# Patient Record
Sex: Female | Born: 1950 | Race: Black or African American | Hispanic: No | Smoking: Former smoker
Health system: Southern US, Community
[De-identification: ages and names within clinical notes are randomized; demographics above are authoritative.]

## PROBLEM LIST (undated history)

## (undated) DIAGNOSIS — E7800 Pure hypercholesterolemia, unspecified: Secondary | ICD-10-CM

## (undated) DIAGNOSIS — I10 Essential (primary) hypertension: Secondary | ICD-10-CM

## (undated) HISTORY — PX: OTHER SURGICAL HISTORY: SHX169

---

## 2009-04-10 ENCOUNTER — Emergency Department (HOSPITAL_BASED_OUTPATIENT_CLINIC_OR_DEPARTMENT_OTHER): Admission: EM | Admit: 2009-04-10 | Discharge: 2009-04-10 | Payer: Self-pay | Admitting: Emergency Medicine

## 2009-12-01 ENCOUNTER — Emergency Department (HOSPITAL_BASED_OUTPATIENT_CLINIC_OR_DEPARTMENT_OTHER): Admission: EM | Admit: 2009-12-01 | Discharge: 2009-12-01 | Payer: Self-pay | Admitting: Emergency Medicine

## 2010-02-20 ENCOUNTER — Emergency Department (HOSPITAL_BASED_OUTPATIENT_CLINIC_OR_DEPARTMENT_OTHER): Admission: EM | Admit: 2010-02-20 | Discharge: 2010-02-20 | Payer: Self-pay | Admitting: Emergency Medicine

## 2010-02-20 ENCOUNTER — Ambulatory Visit: Payer: Self-pay | Admitting: Diagnostic Radiology

## 2010-02-24 ENCOUNTER — Ambulatory Visit: Payer: Self-pay | Admitting: Internal Medicine

## 2010-02-24 DIAGNOSIS — M542 Cervicalgia: Secondary | ICD-10-CM

## 2010-02-24 DIAGNOSIS — E669 Obesity, unspecified: Secondary | ICD-10-CM | POA: Insufficient documentation

## 2010-02-24 DIAGNOSIS — E785 Hyperlipidemia, unspecified: Secondary | ICD-10-CM

## 2010-02-24 DIAGNOSIS — I10 Essential (primary) hypertension: Secondary | ICD-10-CM | POA: Insufficient documentation

## 2010-03-01 DIAGNOSIS — J984 Other disorders of lung: Secondary | ICD-10-CM

## 2010-03-04 ENCOUNTER — Telehealth: Payer: Self-pay | Admitting: Internal Medicine

## 2010-03-04 ENCOUNTER — Ambulatory Visit: Payer: Self-pay | Admitting: Internal Medicine

## 2010-03-04 DIAGNOSIS — R209 Unspecified disturbances of skin sensation: Secondary | ICD-10-CM | POA: Insufficient documentation

## 2010-03-04 DIAGNOSIS — I2510 Atherosclerotic heart disease of native coronary artery without angina pectoris: Secondary | ICD-10-CM | POA: Insufficient documentation

## 2010-03-04 LAB — CONVERTED CEMR LAB
ALT: 11 units/L (ref 0–35)
AST: 18 units/L (ref 0–37)
Albumin: 4.3 g/dL (ref 3.5–5.2)
Chloride: 105 meq/L (ref 96–112)
Creatinine, Ser: 0.74 mg/dL (ref 0.40–1.20)
Glucose, Bld: 73 mg/dL (ref 70–99)
HDL: 65 mg/dL (ref >39)
Hgb A1c MFr Bld: 5.8 % — ABNORMAL HIGH (ref <5.7)
Indirect Bilirubin: 0.3 mg/dL (ref 0.0–0.9)
LDL Cholesterol: 57 mg/dL (ref 0–99)
Potassium: 4.3 meq/L (ref 3.5–5.3)
Sodium: 142 meq/L (ref 135–145)
TSH: 0.354 microintl units/mL (ref 0.350–4.500)
Total Bilirubin: 0.4 mg/dL (ref 0.3–1.2)
Total Protein: 6.8 g/dL (ref 6.0–8.3)
Triglycerides: 55 mg/dL (ref <150)

## 2010-03-06 ENCOUNTER — Encounter: Payer: Self-pay | Admitting: Internal Medicine

## 2010-03-07 ENCOUNTER — Ambulatory Visit: Payer: Self-pay | Admitting: Diagnostic Radiology

## 2010-03-07 ENCOUNTER — Ambulatory Visit (HOSPITAL_BASED_OUTPATIENT_CLINIC_OR_DEPARTMENT_OTHER): Admission: RE | Admit: 2010-03-07 | Discharge: 2010-03-07 | Payer: Self-pay | Admitting: Internal Medicine

## 2010-03-08 ENCOUNTER — Telehealth: Payer: Self-pay | Admitting: Internal Medicine

## 2010-03-14 ENCOUNTER — Encounter: Payer: Self-pay | Admitting: Internal Medicine

## 2010-03-14 LAB — CONVERTED CEMR LAB
AST: 16 units/L (ref 0–37)
Albumin: 4.2 g/dL (ref 3.5–5.2)
Alkaline Phosphatase: 95 units/L (ref 39–117)
BUN: 8 mg/dL (ref 6–23)
CO2: 29 meq/L (ref 19–32)
HDL: 68 mg/dL (ref >39)
Hgb A1c MFr Bld: 6 % — ABNORMAL HIGH (ref <5.7)
LDL Cholesterol: 42 mg/dL (ref 0–99)
Total CHOL/HDL Ratio: 1.7
VLDL: 8 mg/dL (ref 0–40)

## 2010-03-16 ENCOUNTER — Telehealth: Payer: Self-pay | Admitting: Internal Medicine

## 2010-03-24 ENCOUNTER — Telehealth: Payer: Self-pay | Admitting: Internal Medicine

## 2010-03-30 ENCOUNTER — Ambulatory Visit (HOSPITAL_COMMUNITY): Admission: RE | Admit: 2010-03-30 | Discharge: 2010-03-30 | Payer: Self-pay | Admitting: Internal Medicine

## 2010-03-31 ENCOUNTER — Telehealth: Payer: Self-pay | Admitting: Internal Medicine

## 2010-03-31 DIAGNOSIS — R220 Localized swelling, mass and lump, head: Secondary | ICD-10-CM

## 2010-03-31 DIAGNOSIS — R221 Localized swelling, mass and lump, neck: Secondary | ICD-10-CM

## 2010-04-06 ENCOUNTER — Ambulatory Visit: Payer: Self-pay | Admitting: Diagnostic Radiology

## 2010-04-06 ENCOUNTER — Ambulatory Visit (HOSPITAL_BASED_OUTPATIENT_CLINIC_OR_DEPARTMENT_OTHER): Admission: RE | Admit: 2010-04-06 | Discharge: 2010-04-06 | Payer: Self-pay | Admitting: Internal Medicine

## 2010-04-07 ENCOUNTER — Telehealth: Payer: Self-pay | Admitting: Internal Medicine

## 2010-04-22 ENCOUNTER — Ambulatory Visit: Payer: Self-pay | Admitting: Internal Medicine

## 2010-04-25 ENCOUNTER — Encounter (INDEPENDENT_AMBULATORY_CARE_PROVIDER_SITE_OTHER): Payer: Self-pay | Admitting: *Deleted

## 2010-05-03 ENCOUNTER — Encounter: Payer: Self-pay | Admitting: Internal Medicine

## 2010-05-09 ENCOUNTER — Telehealth: Payer: Self-pay | Admitting: Family

## 2010-05-10 ENCOUNTER — Telehealth: Payer: Self-pay | Admitting: Internal Medicine

## 2010-05-10 ENCOUNTER — Ambulatory Visit: Payer: Self-pay | Admitting: Internal Medicine

## 2010-05-11 ENCOUNTER — Telehealth: Payer: Self-pay | Admitting: Internal Medicine

## 2010-05-12 ENCOUNTER — Telehealth: Payer: Self-pay | Admitting: Internal Medicine

## 2010-05-14 ENCOUNTER — Ambulatory Visit (HOSPITAL_BASED_OUTPATIENT_CLINIC_OR_DEPARTMENT_OTHER): Admission: RE | Admit: 2010-05-14 | Discharge: 2010-05-14 | Payer: Self-pay | Admitting: Internal Medicine

## 2010-05-14 ENCOUNTER — Ambulatory Visit: Payer: Self-pay | Admitting: Diagnostic Radiology

## 2010-05-16 ENCOUNTER — Telehealth: Payer: Self-pay | Admitting: Internal Medicine

## 2010-05-17 ENCOUNTER — Telehealth: Payer: Self-pay | Admitting: Internal Medicine

## 2010-05-18 ENCOUNTER — Telehealth: Payer: Self-pay | Admitting: Internal Medicine

## 2010-05-24 ENCOUNTER — Encounter: Admission: RE | Admit: 2010-05-24 | Discharge: 2010-06-27 | Payer: Self-pay | Admitting: Internal Medicine

## 2010-05-30 ENCOUNTER — Encounter: Payer: Self-pay | Admitting: Internal Medicine

## 2010-06-10 ENCOUNTER — Telehealth: Payer: Self-pay | Admitting: Internal Medicine

## 2010-06-13 ENCOUNTER — Ambulatory Visit: Payer: Self-pay | Admitting: Internal Medicine

## 2010-07-07 ENCOUNTER — Encounter: Payer: Self-pay | Admitting: Internal Medicine

## 2010-09-01 ENCOUNTER — Ambulatory Visit: Payer: Self-pay | Admitting: Family

## 2010-09-01 DIAGNOSIS — J329 Chronic sinusitis, unspecified: Secondary | ICD-10-CM | POA: Insufficient documentation

## 2010-11-20 ENCOUNTER — Encounter: Payer: Self-pay | Admitting: Internal Medicine

## 2010-11-29 NOTE — Progress Notes (Signed)
Summary: med question   Phone Note Call from Patient   Caller: Patient Call For: Quetzally Callas  Summary of Call: when she takes the muscle relaxor it is 4 or 5 hours before it starts to work.  She wants the same generic muscle relaxor and the same pain med that she had before.  She is scheduled for her MRI today at 3:30.  Initial call taken by: Roselle Locus,  May 11, 2010 8:35 AM  Follow-up for Phone Call        Pt called back, stated she is cancelling today's appt with Dr Marisa Sprinkles. She is not going to have the biopsy until she sees a cardiologist Conchita Tomerlin  May 11, 2010 9:21 AM  Additional Follow-up for Phone Call Additional follow up Details #1::        metaxolone is same medication she was prescribed in the past Additional Follow-up by: D. Thomos Lemons DO,  May 11, 2010 11:58 AM    Additional Follow-up for Phone Call Additional follow up Details #2::    patient states that she can not tolerate the Tramadol. It makes her have a upset stomach the same was aspirin does. Is there something esle that she could  take for pain. Follow-up by: Glendell Docker CMA,  May 11, 2010 1:34 PM  Additional Follow-up for Phone Call Additional follow up Details #3:: Details for Additional Follow-up Action Taken: see next phone note Additional Follow-up by: D. Thomos Lemons DO,  May 12, 2010 5:37 PM

## 2010-11-29 NOTE — Progress Notes (Signed)
Summary: MRI Results  Phone Note Outgoing Call   Summary of Call: call pt - MRI of C spine shows multilevel cervical degenerative disc disease.  nerve root at C5 - C6 can be affected.  I will further discuss MRI results at next follow up appointment.   Initial call taken by: D. Thomos Lemons DO,  May 16, 2010 6:22 PM  Follow-up for Phone Call        attempted to contact patient at [phone], no answer, a detailed voice message was left informing patient per Dr Artist Pais instructions Follow-up by: Glendell Docker CMA,  May 17, 2010 9:54 AM

## 2010-11-29 NOTE — Assessment & Plan Note (Signed)
Summary: NUMBING IN HANDS/DK   Vital Signs:  Patient profile:   60 year old female Height:      65 inches Weight:      191.50 pounds BMI:     31.98 O2 Sat:      100 % on Room air Temp:     98.0 degrees F oral Pulse rate:   80 / minute Pulse rhythm:   regular Resp:     18 per minute BP sitting:   150 / 80  (left arm) Cuff size:   large  Vitals Entered By: Glendell Docker CMA (Mar 04, 2010 4:00 PM)  O2 Flow:  Room air CC: Rm 3- Unresolved numbness   Primary Care Provider:  Dondra Spry DO  CC:  Rm 3- Unresolved numbness.  History of Present Illness: 60 year old female for followup Patient complains of persistent intermittent numbness and tingling in right arm and right hand Patient seen for right neck and shoulder discomfort on previous visit neck and shoulder pain is better  Allergies: 1)  ! Morphine  Past History:  Past Medical History: Hyperlipidemia Hypertension CAD - 3 stents (Dr Harriette Bouillon [facility])  cath performed 2007- Hx of palpitations and syncope  Hx of tachycardia - s/p radiofreq ablation Hx of precancerous skin cancer around vulvar area - Dr. 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   Lungs:  normal respiratory effort and normal breath sounds.   Heart:  normal rate, regular rhythm, and no gallop.   Msk:  no thenar atrophy,  hand grip normal Neurologic:  cranial nerves  II-XII intact and DTRs symmetrical and normal.     Impression & Recommendations:  Problem # 1:  PARESTHESIA, HANDS (ICD-40.64)  60 year old  female with intermittent right hand and arm numbness.  carpal tunnel syndrome vs cervical radiculopathy. Refer to orthopedics for further evaluation (nerve conduction study)  Use wrist splint at bedtime.  Reviewed stretching exercises. Orders: Orthopedic Referral (Ortho)  Problem # 2:  HYPERTENSION (ICD-401.9) blood pressure control is suboptimal. Add losartan 50 mg Her updated medication list for this problem includes:    Diltiazem Hcl 120 Mg Tabs (Diltiazem hcl) .Marland Kitchen... Take 1 tab by mouth once daily    Metoprolol Succinate 100 Mg Xr24h-tab (Metoprolol succinate) .Marland Kitchen... Take 1/2 tab by mouth once daily    Losartan Potassium 50 Mg Tabs (Losartan potassium) ..... One by mouth once daily  BP today: 150/80 Prior BP: 152/90 (02/24/2010)  Problem # 3:  PULMONARY NODULE (ICD-518.89) awaiting CT of Chest  Complete Medication List: 1)  Metaxalone 800 Mg Tabs (Metaxalone) .... One by mouth two times a day as needed 2)  Plavix 75 Mg Tabs (Clopidogrel bisulfate) .... Take 1 tab by mouth once daily 3)  Diltiazem Hcl 120 Mg Tabs (Diltiazem hcl) .... Take 1 tab by mouth once daily 4)  Metoprolol Succinate 100 Mg Xr24h-tab (Metoprolol succinate) .... Take 1/2 tab by mouth  once daily 5)  Isosorbide Mononitrate Cr 30 Mg Xr24h-tab (Isosorbide mononitrate) .... Take 1 tab by mouth once daily 6)  Aspir-low 81 Mg Tbec (Aspirin) .... Take 1 tab by mouth once daily 7)  Losartan Potassium 50 Mg Tabs (Losartan potassium) .... One by mouth once daily  Patient Instructions: 1)  Use right wrist splint every night.  2)  Perform stretching exercises as directed. 3)  BMP prior to visit, ICD-9: 401.9 4)  Hepatic Panel prior to visit, ICD-9:  414.00 5)  Lipid Panel prior to visit, ICD-9: 414.00 6)  TSH prior to visit, ICD-9: 401.9 7)  Please schedule a follow-up appointment  in 1 month. 8)  Please return for lab work one (1) week before your next appointment.  Prescriptions: LOSARTAN POTASSIUM 50 MG TABS (LOSARTAN POTASSIUM) one by mouth once daily  #30 x 1   Entered and Authorized by:   D. Thomos Lemons DO   Signed by:   D. Thomos Lemons DO on 03/04/2010   Method used:   Electronically to        [facility] [address] (retail)       [address] [place], [place]  [address]       Ph: [phone] or [phone]       Fax: [phone]   RxID:   [id number]   Current Allergies (reviewed today): ! MORPHINE

## 2010-11-29 NOTE — Progress Notes (Signed)
Summary: ct result  Phone Note Outgoing Call   Summary of Call: call pt - CT of chest confirms left lung nodule.  A PET scan is recommended to further evaluate.   see orders Initial call taken by: D. Thomos Lemons DO,  Mar 08, 2010 1:32 PM  Follow-up for Phone Call        Left message on machine to return my call.  Mervin Kung CMA  Mar 08, 2010 3:43 PM   Left message on machine to return my call.  Mervin Kung CMA  Mar 09, 2010 9:18 AM   Spoke to pt. She was notified by Myriam Jacobson of PET scan yesterday. Verified appt. times for ortho referral and lab work. Pt will f/u as scheduled.  Mervin Kung CMA  Mar 11, 2010 8:59 AM

## 2010-11-29 NOTE — Progress Notes (Signed)
Summary: Refill muscle relaxer  Phone Note Call from Patient Call back at Home Phone [phone]   Caller: Patient Reason for Call: Talk to Nurse Summary of Call: Pt started having muscle spasms again on Saturday afternoon, she has been taking Tylenol but she is still in pain, she would like a refill of the muscle relaxer since it seems to work Initial call taken by: Lannette Donath,  May 09, 2010 8:54 AM  Follow-up for Phone Call        Left message with [facility] to confirm that patient went to PT as scheduled. Follow-up by: Lemont Fillers FNP,  May 09, 2010 10:44 AM  Additional Follow-up for Phone Call Additional follow up Details #1::        Pt called back, is in pain, pls call  Maty Tomerlin  May 09, 2010 11:30 AM    Additional Follow-up for Phone Call Additional follow up Details #2::    Per Thurston Hole at [facility] rehab states that pt did not show for PT on 03/08/10 and has not rescheduled.  Nicki Guadalajara Fergerson CMA Duncan Dull)  May 09, 2010 1:59 PM  Follow-up by: Lemont Fillers FNP,  May 09, 2010 2:50 PM  Additional Follow-up for Phone Call Additional follow up Details #3:: Details for Additional Follow-up Action Taken: Patient did not keep her appointment with PT as recommended by Dr. Artist Pais for her pain.  No further refills until she is seen in the office for visit.  In the meantime she can use ibuprofen 400mg  every 6 hours as needed for pain. Pls advise patient. Additional Follow-up by: Lemont Fillers FNP,  May 09, 2010 2:53 PM   patient was advised to take Ibuprofen 400mg  every 6 hours as needed for pain. She was advised to schedule office visit if pain is not resolved per Sandford Craze instructions Glendell Docker CMA  May 09, 2010 3:07 PM

## 2010-11-29 NOTE — Progress Notes (Signed)
Summary: still having tingling and numbness   Phone Note Call from Patient Call back at [facility] Phone [phone]   Caller: Patient Call For: Cindy Ellison  Summary of Call: She wants Dr Artist Pais to know she is still having tingling and numbness in right arm and down her right side even with the muscle relaxor.   Also [place] she [place] not feel comfortable traveling until she is aware of what is wrong.  Would Dr Artist Pais give her a note to excuse her from this conference  Initial call taken by: Roselle Locus,  Mar 04, 2010 8:30 AM  Follow-up for Phone Call        call pt - I suggest she come back to our office for re evaluation today if possible.   we can provide note at office visit Follow-up by: D. Thomos Lemons DO,  Mar 04, 2010 1:09 PM  Additional Follow-up for Phone Call Additional follow up Details #1::        patient advised per Dr Artist Pais instructions, appointment has been scheduled for 3:45pm today Additional Follow-up by: Glendell Docker CMA,  Mar 04, 2010 1:27 PM

## 2010-11-29 NOTE — Assessment & Plan Note (Signed)
Summary: STILL IN PAIN/DT   Vital Signs:  Patient profile:   60 year old female Weight:      195.50 pounds BMI:     32.65 O2 Sat:      100 % on Room air Temp:     98.2 degrees F oral Pulse rate:   62 / minute Pulse rhythm:   regular Resp:     20 per minute BP sitting:   124 / 80  (right arm) Cuff size:   large  Vitals Entered By: Glendell Docker CMA (May 10, 2010 4:48 PM)  O2 Flow:  Room air CC: Rm 3- Right side body pain Is Patient Diabetic? No   Primary Care Provider:  Dondra Spry DO  CC:  Rm 3- Right side body pain.  History of Present Illness:  60 year old African American female complains of recurrence of right-sided pain/muscle spasm Discomfort is constant and she describes as  aching sensation no improvement and over-the-counter analgesics no right upper chamois weakness  Preventive Screening-Counseling & Management  Alcohol-Tobacco     Smoking Status: quit  Allergies: 1)  ! Morphine  Past History:  Past Medical History: Hyperlipidemia  Hypertension CAD - 3 stents (Dr Harriette Bouillon [facility])  cath performed 2007- Hx of palpitations and syncope  Hx of tachycardia - s/p radiofreq ablation Hx of precancerous skin cancer around vulvar area - Dr. Hyacinth Meeker  (s/p surgery 2009) GYN OSA - uses CPAP ( initiated by cardiologist)   Social History: Smoking Status:  quit  Physical Exam  General:  alert, well-developed, and well-nourished.   Neck:  supple and no masses.  decreased range of motion - cervical sidebending and rotation Lungs:  normal respiratory effort and normal breath sounds.   Heart:  normal rate, regular rhythm, and no gallop.   Neurologic:  cranial nerves II-XII intact and DTRs symmetrical and normal.     Impression & Recommendations:  Problem # 1:  NECK AND BACK PAIN (ICD-43.47) 60 year old African American female with recurrent right-sided neck pain. 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 One by mouth two times a day as needed    Aspir-low 81 Mg Tbec (Aspirin) .Marland Kitchen... Take 1 tab by mouth once daily    Tramadol Hcl 50 Mg Tabs (Tramadol hcl) ..... One by mouth two times a day as needed    Hydrocodone-acetaminophen 5-500 Mg Tabs (Hydrocodone-acetaminophen) ..... One by mouth two times a day prn  Orders: MRI without Contrast (MRI w/o Contrast)  Complete Medication List: 1)  Metaxalone 800 Mg Tabs (Metaxalone) .... One by mouth two times a day as needed 2)  Plavix 75 Mg Tabs (Clopidogrel bisulfate) .... Take 1 tab by mouth once daily 3)  Diltiazem Hcl 120 Mg Tabs (Diltiazem hcl) .... Take 1 tab by mouth once daily 4)  Metoprolol Succinate 100 Mg Xr24h-tab (Metoprolol succinate) .... Take 1/2 tab by mouth once daily 5)  Isosorbide Mononitrate Cr 30 Mg Xr24h-tab (Isosorbide mononitrate) .... Take 1 tab by mouth once daily 6)  Aspir-low 81 Mg Tbec (Aspirin) .... Take 1 tab by mouth once daily 7)  Losartan Potassium 50 Mg Tabs (Losartan potassium) .... One by mouth once daily 8)  Tramadol Hcl 50 Mg Tabs (Tramadol hcl) .... One by mouth two times a day as needed 9)  Alprazolam 0.25 Mg Tabs (Alprazolam) .... Take one  to two tabs 30 mins before mri 10)  Hydrocodone-acetaminophen 5-500 Mg Tabs (Hydrocodone-acetaminophen) .... One by mouth two times a day prn  Patient Instructions: 1)  Call our office if your symptoms do not  improve or gets worse. 2)  Please schedule a follow-up appointment in 1 month. Prescriptions: ALPRAZOLAM 0.25 MG TABS (ALPRAZOLAM) take one to two tabs 30 mins before MRI  #5 x 0   Entered and Authorized by:   D. Thomos Lemons DO   Signed by:   D. Thomos Lemons DO on 05/10/2010   Method used:   Print then Give to Patient   RxID:   [id number] TRAMADOL HCL 50 MG TABS (TRAMADOL HCL) one by mouth two times a day as needed  #30 x 0   Entered and  Authorized by:   D. Thomos Lemons DO   Signed by:   D. Thomos Lemons DO on 05/10/2010   Method used:   Print then Give to Patient   RxID:   [id number] METAXALONE 800 MG TABS (METAXALONE) one by mouth two times a day as needed  #30 x 0   Entered and Authorized by:   D. Thomos Lemons DO   Signed by:   D. Thomos Lemons DO on 05/10/2010   Method used:   Print then Give to Patient   RxID:   [id number]   Current Allergies (reviewed today): ! MORPHINE

## 2010-11-29 NOTE — Miscellaneous (Signed)
Summary: lab orders: bmp,hepfx,lipid,tsh,hgba1c  Clinical Lists Changes  Orders: Added new Test order of T-Basic Metabolic Panel [phone]) - Signed Added new Test order of T-Hepatic Function [id number]) - Signed Added new Test order of T-Lipid Profile [phone]) - Signed Added new Test order of T-TSH [phone]) - Signed Added new Test order of T- Hemoglobin A1C ([id number]) - Signed

## 2010-11-29 NOTE — Letter (Signed)
Summary: Primary Care Consult Scheduled Letter  [place] [facility]  [address]   [place], [place] [address]   Phone: [phone]  Fax: [phone]      04/25/2010 MRN: [id number]  [facility] [address] [place], [place]  [address]    Dear Ms. Azbill,      We have scheduled an appointment for you.  At the recommendation of Dr.YOO , we have scheduled you a consult with [place] [facility], DR PHIPPS  on JULY 5,2011 at 4PM.  Their address is_[address],[place] The office phone number is [phone].  If this appointment day and time is not convenient for you, please feel free to call the office of the doctor you are being referred to at the number listed above and reschedule the appointment.     Please take you CT and Pet Scan disc to your appointment .       It is important for you to keep your scheduled appointments. We are here to make sure you are given good patient care. If you have questions or you have made changes to your appointment, please notify us at  [phone], ask for HELEN.    Thank you,  Darral Dash Patient Care Coordinator [place] at [facility]

## 2010-11-29 NOTE — Consult Note (Signed)
Summary: [facility]   Imported By: Lanelle Bal 04/12/2010 11:41:31  _____________________________________________________________________  External Attachment:    Type:   Image     Comment:   External Document

## 2010-11-29 NOTE — Progress Notes (Signed)
Summary: Crestor Samples  Phone Note Call from Patient Call back at [facility] Phone [phone]   Caller: Patient Call For: D. Thomos Lemons DO Summary of Call: patient called and left voice message requesting samples of Crestor.  Call was returned to patient at [phone], no answerr, voice message was left for patient to return call. Crestor is not on her list of medication. Initial call taken by: Glendell Docker CMA,  June 10, 2010 1:18 PM  Follow-up for Phone Call        patient returned  from patient. She states she has been taking the crestor for years, and it has been prescribed by Dr Timoteo Gaul. She states she is taking Crestor 10mg , patient was in a rush to end the conversation, and stated she normally get samples from Dr Harriette Bouillon office however they are closed today and she did not want to go through any chnages. Patient was made aware approval would have to come from [facility]  because the Crestor is not on our  list of medications that she is taking, patient again was rushing to get off of the line and stated ok and disconnected the call. Follow-up by: Glendell Docker CMA,  June 10, 2010 1:27 PM  Additional Follow-up for Phone Call Additional follow up Details #1::        call Dr. Hans Eden office to confirm pt taking crestor. If yes, pt can pick up samples Additional Follow-up by: D. Thomos Lemons DO,  June 13, 2010 9:41 AM    Additional Follow-up for Phone Call Additional follow up Details #2::    call placed to Dr Theophilus Kinds office at [phone]. I spoke with Toniann Fail she verified that patient is taking the Crestor 52m daily, and ; she states patient was seen on 8/11 and provided one month of samples at that office visit.  Glendell Docker CMA  June 13, 2010 4:59 PM   call was placed to patient at  [phone], no answer, voice message left for patient to return call regarding Crestor Samples. (have been left at front desk for patient pick up ) Glendell Docker CMA  June 13, 2010  5:07 PM   Additional Follow-up for Phone Call Additional follow up Details #3:: Details for Additional Follow-up Action Taken: call placed to patient at [phone], she has been advised samples left at front desk for pick up. She was reminded she missed appointment yesterday. She states she has been busy moivng and forgot. Patient states she will call back to schedule Additional Follow-up by: Glendell Docker CMA,  June 14, 2010 8:11 AM  New/Updated Medications: CRESTOR 10 MG TABS (ROSUVASTATIN CALCIUM) Take 1 tablet by mouth once a day

## 2010-11-29 NOTE — Assessment & Plan Note (Signed)
Summary: new pt est care/dt   Vital Signs:  Patient profile:   60 year old female Height:      65 inches Weight:      188 pounds BMI:     31.40 Temp:     98.2 degrees F oral Pulse rate:   90 / minute BP sitting:   152 / 90  (left arm) Cuff size:   regular  Vitals Entered By: Payton Spark CMA (February 24, 2010 9:27 AM) CC: New to est. F/U ED- seen for B arm pain, shoulder and upper back pain. ED suggests CT for further eval of spot seen on lungs.   Primary Care Provider:  Dondra Spry DO  CC:  New to est. F/U ED- seen for B arm pain and shoulder and upper back pain. ED suggests CT for further eval of spot seen on lungs..  History of Present Illness: 60 yo female to establish and ER f/u  prev PCP in [place]  pt was in ER last sunday,  ER records reviewed not feeling well  bl arm pain,  neck pain, upper shoulder pain,  constant pain mild pain with deep breath describes an aching sensation when she lays on left side, right arm hurts severity 8 out of 10 she was given morphine in ER - caused nausea  no specific trigger.  lifted grandson pain better with rest tylenol dulls the pain she has seen chiropractor in the past - diagnosed with mild scolosis no recent infection, no fever some post nasal gtt  osa - hasn't use mask x 3 weeks,  poor fitting mask,  felt like she was suffocating  Allergies (verified): 1)  ! Morphine  Past History:  Past Medical History: Hyperlipidemia Hypertension CAD - 3 stents (Dr Harriette Bouillon [facility])  cath performed 2007- Hx of palpitations and syncope Hx of tachycardia - s/p radiofreq ablation Hx of precancerous skin cancer around vulvar area - Dr. 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    Physical Exam  General:  alert and overweight-appearing.   Head:  normocephalic and atraumatic.   Ears:  R ear normal and L ear normal.   Mouth:  pharynx pink and moist.   Neck:  supple, no masses, and no carotid bruits.    Lungs:  normal respiratory effort, normal breath sounds, no crackles, and no wheezes.   Heart:  normal rate, regular rhythm, and no gallop.   Abdomen:  soft, non-tender, normal bowel sounds, no masses, no hepatomegaly, and no splenomegaly.   Msk:  increased muscle tone upper trapezius Extremities:  No lower extremity edema  Neurologic:  cranial nerves II-XII intact.   Psych:  normally interactive, good eye contact, not anxious appearing, and not depressed appearing.     Impression & Recommendations:  Problem # 1:  NECK AND BACK PAIN (ICD-723.1) Neck and  upper back pain likely from muscle strain.  use muscle relaxer as directed.  arrange PT Her updated medication list for this problem includes:    Metaxalone 800 Mg Tabs (Metaxalone) ..... One by mouth two times a day as needed    Aspir-low 81 Mg Tbec (Aspirin) .Marland Kitchen... Take 1 tab by mouth once daily  Orders: Physical Therapy Referral (PT)  Problem # 2:  HYPERTENSION (ICD-401.9) She was prev followed by [facility].   goal BP < 130/80.  consider add ARB Her updated medication list for this problem includes:    Diltiazem Hcl 120 Mg Tabs (Diltiazem hcl) .Marland Kitchen... Take 1 tab by mouth once daily    Metoprolol Succinate 100 Mg Xr24h-tab (Metoprolol succinate) .Marland Kitchen... Take 1/2 tab by  mouth once daily  BP today: 152/90  Problem # 3:  HYPERLIPIDEMIA (ICD-272.4) Goal LDL <70.   arrange FLP  Complete Medication List: 1)  Metaxalone 800 Mg Tabs (Metaxalone) .... One by mouth two times a day as needed 2)  Plavix 75 Mg Tabs (Clopidogrel bisulfate) .... Take 1 tab by mouth once daily 3)  Diltiazem Hcl 120 Mg Tabs (Diltiazem hcl) .... Take 1 tab by mouth once daily 4)  Metoprolol Succinate 100 Mg Xr24h-tab (Metoprolol succinate) .... Take 1/2 tab by mouth once daily 5)  Isosorbide Mononitrate Cr 30 Mg Xr24h-tab (Isosorbide mononitrate) .... Take 1 tab by mouth once daily 6)  Aspir-low 81 Mg Tbec (Aspirin) .... Take 1 tab by mouth once daily  Patient Instructions: 1)  Please schedule a follow-up appointment in 1 month. 2)  Call our office if your symptoms do not  improve or gets worse. 3)  BMP prior to visit, ICD-9: 401.9 4)  Hepatic Panel prior to visit, ICD-9: 272.4 5)  Lipid Panel prior to visit, ICD-9:  272.4 6)  TSH prior to visit, ICD-9: 272.4 7)  HbgA1C prior to visit, ICD-9: 278.00 8)  Please return for lab work one (1) week before your next appointment.  Prescriptions: METAXALONE 800 MG TABS (METAXALONE) one by mouth two times a day as needed  #30 x 0   Entered and Authorized by:   D. Thomos Lemons DO   Signed by:   D. Thomos Lemons DO on 02/24/2010   Method used:   Electronically to        [facility] [address] (retail)       [address] [place], [place]  [address]       Ph: [phone] or [phone]       Fax: [phone]   RxID:   [id number]   Appended Document: new pt est care/dt     Allergies: 1)  ! Morphine   Impression & Recommendations:  Problem # 1:  PULMONARY NODULE (ICD-518.89)  CXR performed in ER showed - IMPRESSION:   0.8 cm nodular opacity left mid lung.  Chest CT recommend for   further evaluation.  Arrange CT of chest with IV contrast  Orders: Radiology Referral (Radiology)  Complete  Medication List: 1)  Metaxalone 800 Mg Tabs (Metaxalone) .... One by mouth two times a day as needed 2)  Plavix 75 Mg Tabs (Clopidogrel bisulfate) .... Take 1 tab by mouth once daily 3)  Diltiazem Hcl 120 Mg Tabs (Diltiazem hcl) .... Take 1 tab by mouth once daily 4)  Metoprolol Succinate 100 Mg Xr24h-tab (Metoprolol succinate) .... Take 1/2 tab by mouth once daily 5)  Isosorbide Mononitrate Cr 30 Mg Xr24h-tab (Isosorbide mononitrate) .... Take 1 tab by mouth once daily 6)  Aspir-low 81 Mg Tbec (Aspirin) .... Take 1 tab by mouth once daily

## 2010-11-29 NOTE — Progress Notes (Signed)
Summary: missed PET appt  Phone Note Call from Patient Call back at Home Phone [phone]   Caller: Patient Call For: D. Thomos Lemons DO Summary of Call: Pt called to advise Dr Artist Pais that she does not have transportation today so she could not make her PET appt, she spoke with the tech and he advised her that she would not be able to have a PET for a month Initial call taken by: Lannette Donath,  Mar 24, 2010 9:00 AM  Follow-up for Phone Call        Myriam Jacobson,  please find out what is scheduling issue Follow-up by: D. Thomos Lemons DO,  Mar 24, 2010 12:09 PM  Additional Follow-up for Phone Call Additional follow up Details #1::        Patient rescheduled  for PET Scan  [facility]   June  1,2011 ,  spoke with pt  appt confirmed   Need new order  ,  Additional Follow-up by: Darral Dash,  Mar 25, 2010 8:40 AM

## 2010-11-29 NOTE — Letter (Signed)
Summary: Generic Letter  [place] [facility]  [address]   [place], [place] [address]   Phone: [phone]  Fax: [phone]      03/04/2010   Cindy Ellison [address] [place], [place]  [address]      To Whom it may concern:  Please excuse [name] from up coming conference due to medical reasons. Please feel free to contact our office if you have any questions regarding this matter.           Sincerely,   Glendell Docker CMA Dr. Thomos Lemons

## 2010-11-29 NOTE — Progress Notes (Signed)
Summary: wants mri results and now has numbness   Phone Note Call from Patient Call back at Home Phone [phone]   Caller: Patient Call For: Sheralyn Pinegar  Summary of Call: she would like the MRI results.  She is still in pain and now she has numbness in her right hand in the index finger.   Initial call taken by: Roselle Locus,  May 17, 2010 10:03 AM  Follow-up for Phone Call        we discussed MRI results.  I suggest we defer referral to neurosurgeon unless symptoms severe. continue symptomatic treatment. trial of PT  Follow-up by: D. Thomos Lemons DO,  May 17, 2010 11:56 AM

## 2010-11-29 NOTE — Progress Notes (Signed)
  Phone Note Outgoing Call   Summary of Call: called pt re:  results of PET scan.  Increased uptake in right neck area.   we discussed obtaining CT of neck with IV contrast to evaluate Initial call taken by: D. Thomos Lemons DO,  March 31, 2010 5:17 PM  Follow-up for Phone Call        CT of neck scheduled [facility]  June 8th spoke with pt appt confirmed  Follow-up by: Darral Dash,  April 01, 2010 4:06 PM  New Problems: NECK MASS (ICD-784.2)   New Problems: NECK MASS (ICD-784.2)

## 2010-11-29 NOTE — Assessment & Plan Note (Signed)
Summary: 1 month follo wup/mhf   Vital Signs:  Patient profile:   60 year old female Height:      65 inches Weight:      192.50 pounds BMI:     32.15 O2 Sat:      99 % on Room air Temp:     98.1 degrees F oral Pulse rate:   62 / minute Pulse rhythm:   regular Resp:     18 per minute BP sitting:   134 / 80  (right arm) Cuff size:   large  Vitals Entered By: Glendell Docker CMA (April 22, 2010 4:07 PM)  O2 Flow:  Room air CC: Rm 3- 1 Month follow up Comments discuss follow up for ENT, pharmacy chnage to [facility] in [place]   Primary Care Provider:  Dondra Spry DO  CC:  Rm 3- 1 Month follow up.  History of Present Illness: 60 yo aa  for fu reviewed wu for pulm nodule previous smoker pet scan showed activity in neck she was referred to ent but never went she requests referral to ent in [place]  no hx of radiation exposure  Allergies: 1)  ! Morphine  Past History:  Past Medical History: Hyperlipidemia Hypertension CAD - 3 stents (Dr Harriette Bouillon [facility] cardi[facility])  cath performed 2007- Hx of palpitations and syncope  Hx of tachycardia - s/p radiofreq ablation Hx of precancerous skin cancer around vulvar area - Dr. 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   Mouth:  pharynx pink and moist.   Neck:  supple and no masses.   Lungs:  normal respiratory effort and normal breath sounds.   Heart:  normal rate, regular rhythm, and no gallop.     Impression & Recommendations:  Problem # 1:  NECK  MASS (ICD-784.2) refer to ent in [place].  Complete Medication List: 1)  Metaxalone 800 Mg Tabs (Metaxalone) .... One by mouth two times a day as needed 2)  Plavix 75 Mg Tabs (Clopidogrel bisulfate) .... Take 1 tab by mouth once daily 3)  Diltiazem Hcl 120 Mg Tabs (Diltiazem hcl) .... Take 1 tab by mouth once daily 4)  Metoprolol Succinate 100 Mg Xr24h-tab (Metoprolol succinate) .... Take 1/2 tab by mouth once daily 5)  Isosorbide Mononitrate Cr 30 Mg Xr24h-tab (Isosorbide mononitrate) .... Take 1 tab by mouth once daily 6)  Aspir-low 81 Mg Tbec (Aspirin) .... Take 1 tab by mouth once daily 7)  Losartan Potassium 50 Mg Tabs (Losartan potassium) .... One by mouth once daily  Patient Instructions: 1)  Please schedule a follow-up appointment in 2 months. 2)  Obtain CD of xray studies before your consultation with ENT  Current Allergies (reviewed today): ! MORPHINE

## 2010-11-29 NOTE — Progress Notes (Signed)
  Phone Note Outgoing Call   Summary of Call: discussed results of CT scan of neck with pt.  I suggest referral to ENT ASAP see orders Initial call taken by: D. Thomos Lemons DO,  April 07, 2010 5:30 PM  Follow-up for Phone Call        Pt is schedule to see Dr Conley Simmonds    June 15.  Follow-up by: Darral Dash,  April 11, 2010 11:10 AM

## 2010-11-29 NOTE — Progress Notes (Signed)
Summary: R/S PET scan  Phone Note Call from Patient Call back at [phone]   Summary of Call: Pt called and cancelled PET scan for 03/17/10 due to EOGs and staff shortage.  Wants to r/s for next week.  Called [facility] and r/s for 03/24/10 @ 6:30am.  Left message on machine for pt to return my call.  Mervin Kung CMA  Mar 16, 2010 2:27 PM   Follow-up for Phone Call        Pt returned my and stated she could not do that early in the morning.  R/s appt for 03/23/10 @ 11:30. Pt advised of new date/time and to be NPO except for water after 7:30am that day per Brett Canales in radiology.  Mervin Kung CMA  Mar 16, 2010 4:22 PM

## 2010-11-29 NOTE — Miscellaneous (Signed)
Summary: lab test order  Clinical Lists Changes  Orders: Added new Test order of T-Basic Metabolic Panel [phone]) - Signed Added new Test order of T-Hepatic Function [phone]) - Signed Added new Test order of T-Lipid Profile [phone]) - Signed Added new Test order of T- Hemoglobin A1C ([phone]) - Signed Added new Test order of T-TSH ([phone]) - Signed

## 2010-11-29 NOTE — Progress Notes (Signed)
Summary: Pain in hands  Phone Note Call from Patient Call back at Home Phone [phone]   Caller: Patient Summary of Call: Pt states she is no unable to use the computer she is in so much pain, she would like to know if she can take more pain meds or is there something else you recommend, pls call to advise Initial call taken by: Lannette Donath,  May 18, 2010 12:08 PM  Follow-up for Phone Call        pt can try ibuprofen 400 mg by mouth two times a day as needed x 3-5 days.  If persistent hand pain,   I suggest OV Follow-up by: D. Thomos Lemons DO,  May 19, 2010 12:52 PM  Additional Follow-up for Phone Call Additional follow up Details #1::        patient advised per Dr Artist Pais instructions Additional Follow-up by: Glendell Docker CMA,  May 19, 2010 1:14 PM

## 2010-11-29 NOTE — Progress Notes (Signed)
Summary: pain med  Phone Note Call from Patient   Caller: Patient Call For: yoo  Summary of Call: wants to know if Dr Artist Pais as prescribed something different for her pain and when it will be called in  Initial call taken by: Roselle Locus,  May 12, 2010 1:26 PM  Follow-up for Phone Call         call placed to patient at [phone], no answer. A detailed voice message was left informing patient of pain medication sent to pharmacy Follow-up by: Glendell Docker CMA,  May 12, 2010 5:38 PM    New/Updated Medications: HYDROCODONE-ACETAMINOPHEN 5-500 MG TABS (HYDROCODONE-ACETAMINOPHEN) one by mouth two times a day prn Prescriptions: HYDROCODONE-ACETAMINOPHEN 5-500 MG TABS (HYDROCODONE-ACETAMINOPHEN) one by mouth two times a day prn  #30 x 0   Entered and Authorized by:   D. Thomos Lemons DO   Signed by:   D. Thomos Lemons DO on 05/12/2010   Method used:   Telephoned to ...       [facility] [id number] (retail)       [address]       [place], [place]  [address]       Ph: [phone]       Fax: [phone]   RxID:   [id number]

## 2010-11-29 NOTE — Letter (Signed)
   [place] [facility] [address] [place], [place]  [address]  [place] Phone: [phone]      Mar 08, 2010   [facility] [address] [place], [place] [address]  RE:  LAB RESULTS  Dear  Cindy Ellison,  The following is an interpretation of your most recent lab tests.  Please take note of any instructions provided or changes to medications that have resulted from your lab work.  ELECTROLYTES:  Good - no changes needed  KIDNEY FUNCTION TESTS:  Good - no changes needed  LIVER FUNCTION TESTS:  Good - no changes needed  LIPID PANEL:  Good - no changes needed Triglyceride: 55   Cholesterol: 133   LDL: 57   HDL: 65   Chol/HDL%:  2.0 Ratio  THYROID STUDIES:  Thyroid studies normal TSH: 0.354     DIABETIC STUDIES:  Good - no changes needed Blood Glucose: 73   HgbA1C: 5.8          Sincerely Yours,    Dr. Thomos Lemons

## 2010-11-29 NOTE — Progress Notes (Signed)
Summary: Pain Medication Refill  Phone Note Call from Patient Call back at Home Phone [phone]   Caller: Patient Call For: D. Thomos Lemons DO Summary of Call: patient called to state she has scheduled an office visit with Dr Artist Pais this afternoon at 4:30 for evaluation. However she states she is in a lot of pain and was wanting to ge a refill. She was informed that she would need a office visit inorder to be reassess for her pain.  She states that she may have to go to the ER for evaluation, because she is in a lot of pain. She was asked if she wanted to cancell her appointment, but states she will keep it and call back to cancel if she goes to the ER Initial call taken by: Glendell Docker CMA,  May 10, 2010 10:33 AM

## 2010-11-29 NOTE — Letter (Signed)
Summary: [facility]   Imported By: Lanelle Bal 07/26/2010 10:10:26  _____________________________________________________________________  External Attachment:    Type:   Image     Comment:   External Document

## 2010-11-29 NOTE — Miscellaneous (Signed)
Summary: Initial Summary for PT Services/[facility]  Initial Summary for PT Services/[facility]   Imported By: Maryln Gottron 06/07/2010 12:27:21  _____________________________________________________________________  External Attachment:    Type:   Image     Comment:   External Document

## 2010-11-29 NOTE — Assessment & Plan Note (Signed)
Summary: laringitis ear ache/mhf--Rm 5   Vital Signs:  Patient profile:   60 year old female Height:      65 inches Weight:      195.50 pounds BMI:     32.65 Temp:     98.2 degrees F oral Pulse rate:   78 / minute Pulse rhythm:   regular Resp:     18 per minute BP sitting:   130 / 90  (right arm) Cuff size:   large  Vitals Entered By: Mervin Kung CMA Duncan Dull) (September 01, 2010 9:57 AM) CC: Rm 5  Pt states she woke up yesterday with laryngitis, congestion in throat and left ear pain., Hypertension Management Is Patient Diabetic? No Pain Assessment Patient in pain? yes     Location: left ear Comments Pt states she has completed Metaxalone, tramadol, alprazolam and no longer takes losartan potassium. Nicki Guadalajara Fergerson CMA Duncan Dull)  September 01, 2010 10:07 AM    Primary Care Provider:  Dondra Spry DO  CC:  Rm 5  Pt states she woke up yesterday with laryngitis, congestion in throat and left ear pain., and Hypertension Management.  History of Present Illness: Ms Gautney is a 60 year old female with c/o left ear pain x 24 hours.  Tried Coricidin last night. Also complains of sore throat, and dizziness. Notes 1 week history of sinus pain and pressure. Denies known fever.  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     Hypertension History:      Positive major cardiovascular risk factors include female age 64 years old or older, hyperlipidemia, and hypertension.  Negative major cardiovascular risk factors include non-tobacco-user status.        Positive history for target organ damage include ASHD (either angina/prior MI/prior CABG).     Allergies: 1)  ! Morphine  Past History:  Past Medical History: Last updated: 05/10/2010 Hyperlipidemia  Hypertension CAD - 3 stents (Dr Harriette Bouillon [facility] cardiology)  cath performed 2007- Hx of palpitations and syncope  Hx of tachycardia - s/p radiofreq ablation Hx of precancerous skin cancer around vulvar area - Dr. Hyacinth Meeker  (s/p  surgery 2009) GYN OSA - uses CPAP ( initiated by cardiologist)   Past Surgical History: Last updated: 03/04/2010 tried colonoscopy 2 yrs ago - procedure canceled due to poor prep   Review of Systems       see HPI  Physical Exam  General:  Well-developed,well-nourished,in no acute distress; alert,appropriate and cooperative throughout examination Head:  Normocephalic and atraumatic without obvious abnormalities. No apparent alopecia or balding. Ears:  bilateral TMs with mild dullness, no erythema or bulging is noted Mouth:  Oral mucosa and oropharynx without lesions or exudates.  Teeth in good repair. Lungs:  Normal respiratory effort, chest expands symmetrically. Lungs are clear to auscultation, no crackles or wheezes. Heart:  Normal rate and regular rhythm. S1 and S2 normal without gallop, murmur, click, rub or other extra sounds. Cervical Nodes:  Mild cervical LN tenderness to palpation without notable LAD   Impression & Recommendations:  Problem # 1:  SINUSITIS (ICD-473.9) Assessment New Suspect sinusitis with possibly early L OM.  Will plan to treat with amoxicillin.  Symptom relief advised as noted in patient sign out sheets. Her updated medication list for this problem includes:    Amoxicillin 500 Mg Caps (Amoxicillin) ..... One tablet by mouth three times a day x 10 days  Problem # 2:  HYPERTENSION (ICD-401.9) Assessment: Unchanged BP is stable, patient tells  me that she is no longer taking losartan.  Has been checking her BP's regularly at home and reports that BP has been stable.  Plan to continue off of losartan for now.  Pt instructed to arrange a follow up apt with Dr. Artist Pais in 2 months. The following medications were removed from the medication list:    Losartan Potassium 50 Mg Tabs (Losartan potassium) ..... One by mouth once daily Her updated medication list for this problem includes:    Diltiazem Hcl 120 Mg Tabs (Diltiazem hcl) .Marland Kitchen... Take 1 tab by mouth once daily     Metoprolol Succinate 100 Mg Xr24h-tab (Metoprolol succinate) .Marland Kitchen... Take 1/2 tab by mouth once daily  BP today: 130/90 Prior BP: 124/80 (05/10/2010)  10 Yr Risk Heart Disease: N/A  Labs Reviewed: K+: 3.7 (03/14/2010) Creat: : 0.78 (03/14/2010)   Chol: 118 (03/14/2010)   HDL: 68 (03/14/2010)   LDL: 42 (03/14/2010)   TG: 38 (03/14/2010)  Complete Medication List: 1)  Metaxalone 800 Mg Tabs (Metaxalone) .... One by mouth two times a day as needed 2)  Plavix 75 Mg Tabs (Clopidogrel bisulfate) .... Take 1 tab by mouth once daily 3)  Diltiazem Hcl 120 Mg Tabs (Diltiazem hcl) .... Take 1 tab by mouth once daily 4)  Metoprolol Succinate 100 Mg Xr24h-tab (Metoprolol succinate) .... Take 1/2 tab by mouth once daily 5)  Isosorbide Mononitrate Cr 30 Mg Xr24h-tab (Isosorbide mononitrate) .... Take 1 tab by mouth once daily 6)  Aspir-low 81 Mg Tbec (Aspirin) .... Take 1 tab by mouth once daily 7)  Crestor 10 Mg Tabs (Rosuvastatin calcium) .... Take 1 tablet by mouth once a day 8)  Amoxicillin 500 Mg Caps (Amoxicillin) .... One tablet by mouth three times a day x 10 days  Hypertension Assessment/Plan:      The patient's hypertensive risk group is category C: Target organ damage and/or diabetes.  Today's blood pressure is 130/90.  Her blood pressure goal is < 140/90.  Patient Instructions: 1)  Gargle twice daily with salt water. 2)  Take Tylenol 650mg  every 6 hours as needed for pain 3)  You may use over the counter Cepacol lozenges or Chloraseptic spray as needed for sore throat. 4)  Call if you develop fever over 101, increasing sinus pressure, pain with eye movement, increased facial tenderness of swelling, or if you develop visual changes. Prescriptions: AMOXICILLIN 500 MG CAPS (AMOXICILLIN) one tablet by mouth three times a day x 10 days  #30 x 0   Entered and Authorized by:   Lemont Fillers FNP   Signed by:   Lemont Fillers FNP on 09/01/2010   Method used:   Electronically to         [facility] (retail)       [address]       [place], [place]  [address]       Ph: [phone]       Fax: [phone]   RxID:   [id number]    Orders Added: 1)  Est. Patient Level III [440[address]]    Current Allergies (reviewed today): ! MORPHINE

## 2010-12-02 NOTE — Consult Note (Signed)
Summary: [facility]  [facility]   Imported By: Lanelle Bal 05/25/2010 10:31:08  _____________________________________________________________________  External Attachment:    Type:   Image     Comment:   External Document

## 2011-01-17 LAB — POCT CARDIAC MARKERS
CKMB, poc: 1 ng/mL (ref 1.0–8.0)
Myoglobin, poc: 44.9 ng/mL (ref 12–200)
Troponin i, poc: <0.05 ng/mL (ref 0.00–0.09)
Troponin i, poc: <0.05 ng/mL (ref 0.00–0.09)
Troponin i, poc: <0.05 ng/mL (ref 0.00–0.09)

## 2011-01-17 LAB — BASIC METABOLIC PANEL
BUN: 11 mg/dL (ref 6–23)
Calcium: 9.3 mg/dL (ref 8.4–10.5)
GFR calc non Af Amer: >60 mL/min (ref >60)
Sodium: 145 mEq/L (ref 135–145)

## 2011-01-17 LAB — DIFFERENTIAL
Basophils Absolute: 0 10*3/uL (ref 0.0–0.1)
Monocytes Relative: 10 % (ref 3–12)
Neutro Abs: 2.3 10*3/uL (ref 1.7–7.7)

## 2011-01-17 LAB — CBC
HCT: 38 % (ref 36.0–46.0)
MCV: 81.7 fL (ref 78.0–100.0)
Platelets: 189 10*3/uL (ref 150–400)
RBC: 4.66 MIL/uL (ref 3.87–5.11)
WBC: 5.1 10*3/uL (ref 4.0–10.5)

## 2011-12-29 IMAGING — CT CT CHEST W/ CM
2 of 3 series · 15 of 36 positions shown, 18 images · IV contrast (APPLIED)
Comparison: Chest x-ray 02/20/2010.

CLINICAL DATA: Lung nodule on chest x-ray.

CT CHEST WITH CONTRAST
TECHNIQUE: Multidetector CT imaging of the chest was performed
following the standard protocol during bolus administration of
intravenous contrast.
Contrast: 80 ml Cmnipaque-OGG IV.

[Series 2: chest 5.0 b31f · axial · 0.57mm/px · z∈[+1314,+1514]mm · 12 of 48 slices shown, 15 images]
[im 4/48  mediastinal]
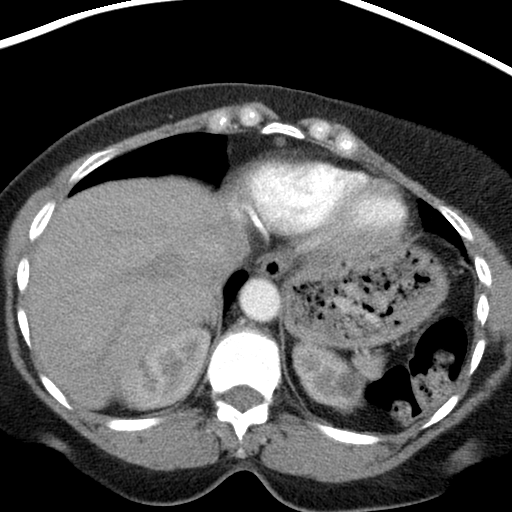
[im 4/48  lung]
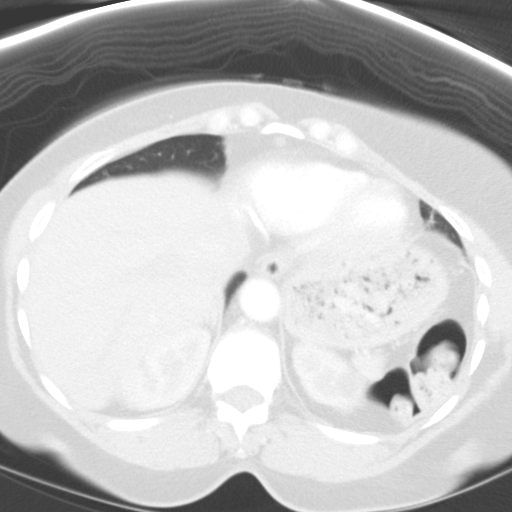
[im 7/48  lung]
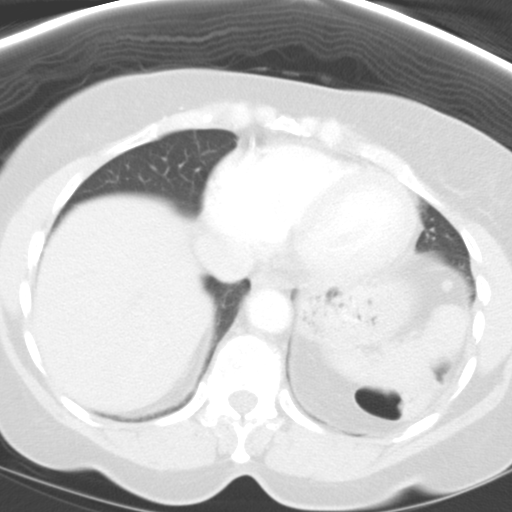
[im 11/48  lung]
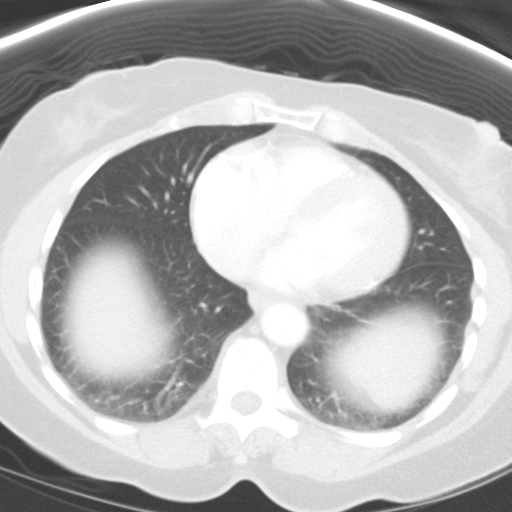
[im 14/48  lung]
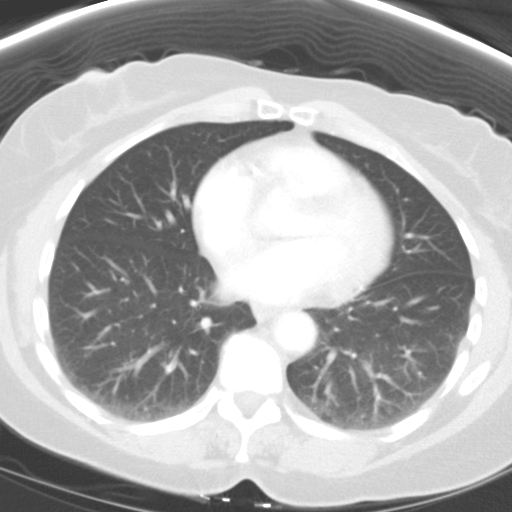
[im 18/48  mediastinal]
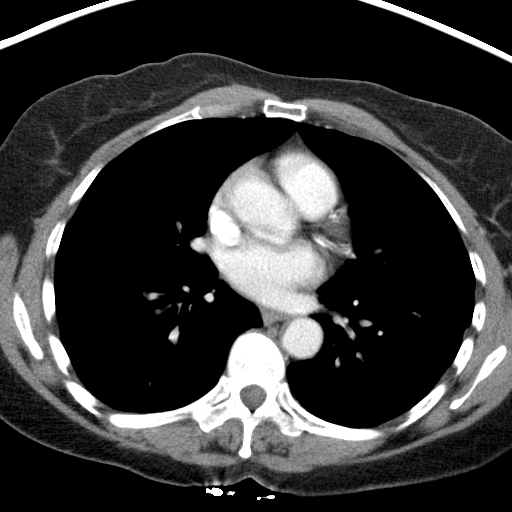
[im 18/48  lung]
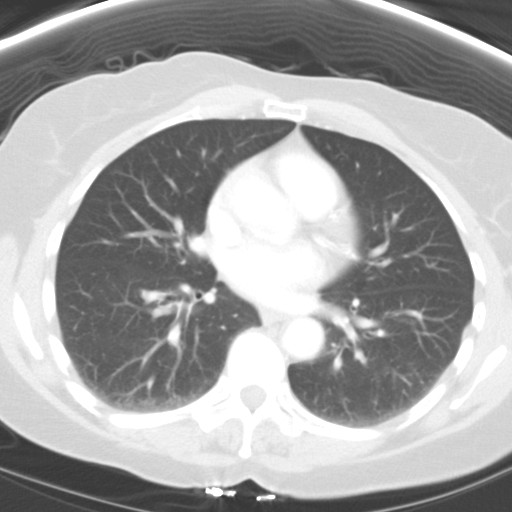
[im 21/48  lung]
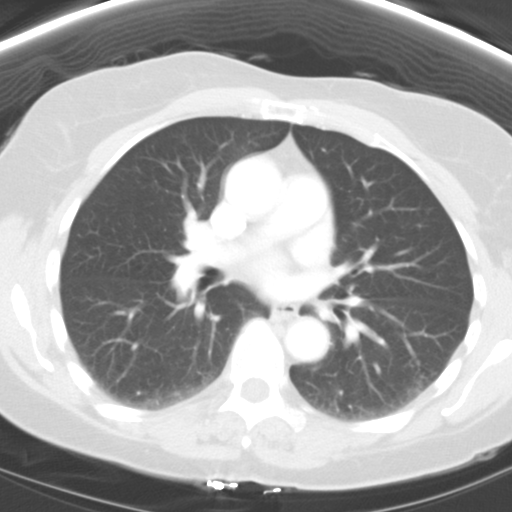
[im 27/48  lung]
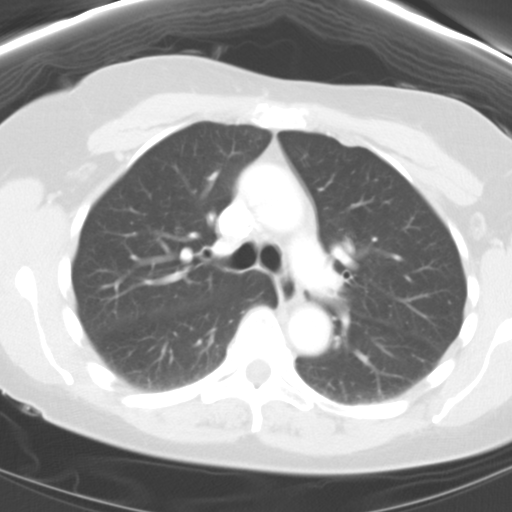
[im 30/48  lung]
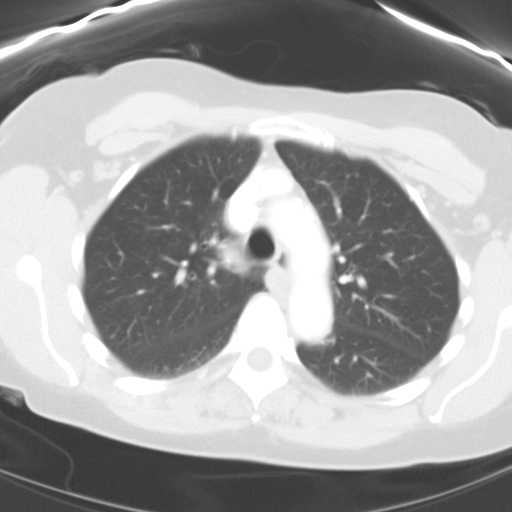
[im 34/48  mediastinal]
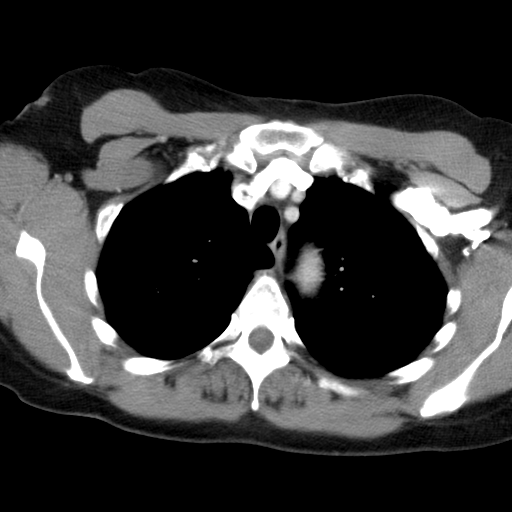
[im 34/48  lung]
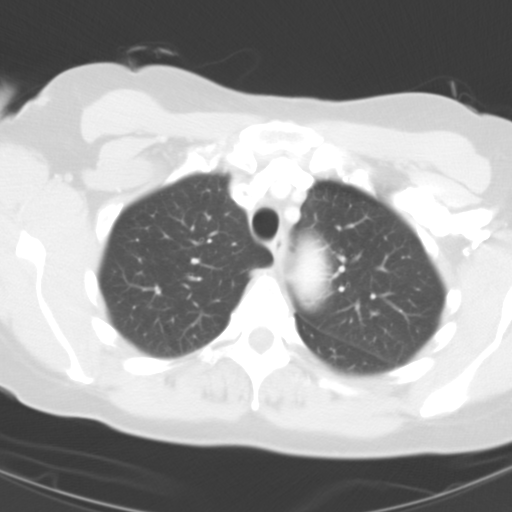
[im 37/48  lung]
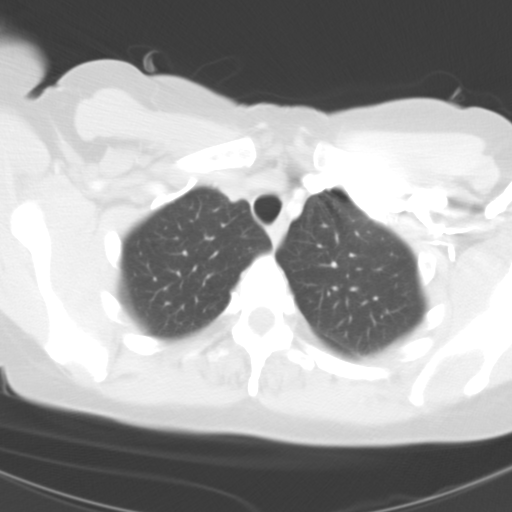
[im 41/48  lung]
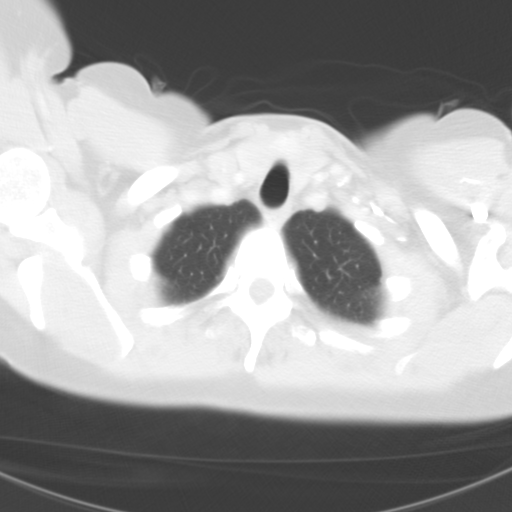
[im 44/48  lung]
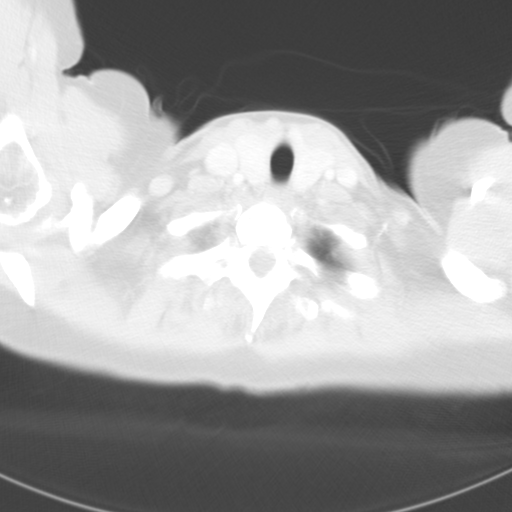

[Series 6: chest 3.0 coronal · coronal · 0.48mm/px · 3 of 81 slices shown]
[im 17/81  lung]
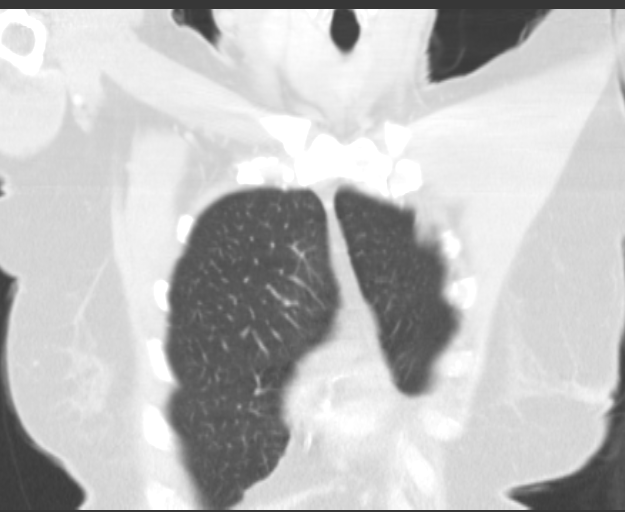
[im 33/81  lung]
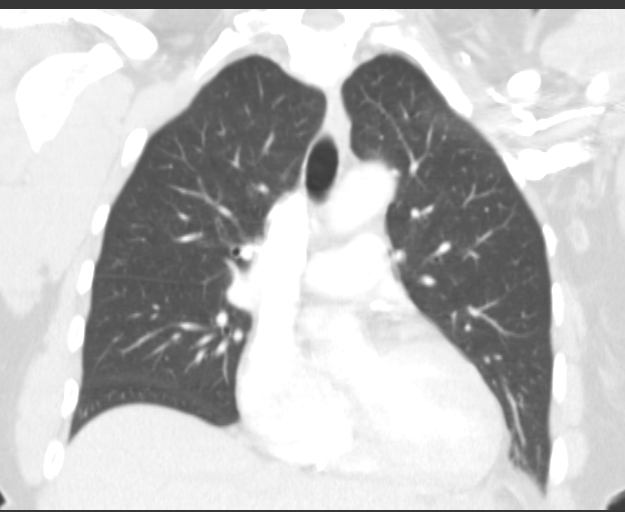
[im 49/81  lung]
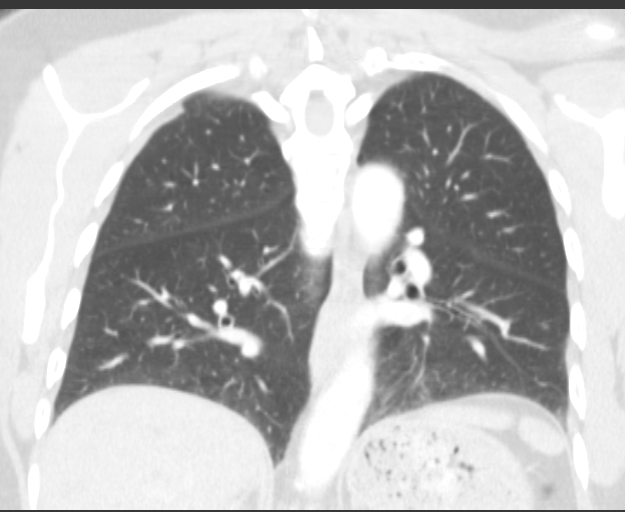

[15 of 36 positions shown; findings below may reference images not displayed]

FINDINGS: Two  nodules are  in the left lower lobe posteriorly,
corresponding to the density on chest x-ray.  The larger nodule
measures 9 mm and has an  irregular spiculated margin.  There is a
satellite adjacent nodule measuring approximately 6 mm.  These
lesions are not calcified.

No other lung nodules are present.  There is no lung mass present.
There is no mediastinal adenopathy.

There is coronary artery calcification of a mild to moderate
degree.  The heart is not enlarged.  No acute bony abnormality.
IMPRESSION: Two nodules are present in the left lower lobe corresponding to the
chest x-ray density.  The larger nodule measures 9 mm and has
spiculated margins.  There is an adjacent 6 mm nodule.  The
findings are suspicious for carcinoma of the lung.  PET CT is
suggested to further evaluation.

Coronary artery disease.

## 2012-01-15 ENCOUNTER — Emergency Department (HOSPITAL_BASED_OUTPATIENT_CLINIC_OR_DEPARTMENT_OTHER)
Admission: EM | Admit: 2012-01-15 | Discharge: 2012-01-15 | Disposition: A | Discharge status: Home / Self Care | Payer: BC Managed Care – PPO | Attending: Emergency Medicine | Admitting: Emergency Medicine

## 2012-01-15 DIAGNOSIS — J029 Acute pharyngitis, unspecified: Secondary | ICD-10-CM | POA: Insufficient documentation

## 2012-01-15 DIAGNOSIS — J069 Acute upper respiratory infection, unspecified: Secondary | ICD-10-CM | POA: Insufficient documentation

## 2012-01-15 DIAGNOSIS — R05 Cough: Secondary | ICD-10-CM | POA: Insufficient documentation

## 2012-01-15 DIAGNOSIS — R059 Cough, unspecified: Secondary | ICD-10-CM | POA: Insufficient documentation

## 2012-01-15 DIAGNOSIS — R5381 Other malaise: Secondary | ICD-10-CM | POA: Insufficient documentation

## 2012-01-15 NOTE — Discharge Instructions (Signed)

## 2012-01-15 NOTE — ED Provider Notes (Signed)
History     CSN: [id number]  Arrival date & time 01/15/12  1191   First MD Initiated Contact with Patient 01/15/12 [phone]      Chief Complaint  Patient presents with  . Sore Throat    (Consider location/radiation/quality/duration/timing/severity/associated sxs/prior treatment) HPI Comments: Patient presents with congestion in her throat and sore throat is worse in the morning for the last 4-5 days. She has felt like she has had some low-grade fevers, but hasn't checked her temperature. She has some drainage down the back of her throat. Denies any chest congestion. Denies any nausea, vomiting, or diarrhea. Denies any difficulty swallowing other than the soreness in the throat. Denies any drooling. She hasn't been using any over-the-counter pain medicines.  Patient is a 61 y.o. Ellison presenting with pharyngitis. The history is provided by the patient.  Sore Throat This is a new problem. Episode onset: 4-5 days ago. The problem occurs constantly. Pertinent negatives include no chest pain, no abdominal pain, no headaches and no shortness of breath.    No past medical history on file.  No past surgical history on file.  No family history on file.  History  Substance Use Topics  . Smoking status: Not on file  . Smokeless tobacco: Not on file  . Alcohol Use: Not on file    OB History    No data available      Review of Systems  Constitutional: Positive for fever, chills and fatigue. Negative for diaphoresis.  HENT: Positive for congestion, sore throat, rhinorrhea and postnasal drip. Negative for sneezing, drooling, mouth sores, trouble swallowing and voice change.   Eyes: Negative.   Respiratory: Positive for cough. Negative for chest tightness and shortness of breath.   Cardiovascular: Negative for chest pain and leg swelling.  Gastrointestinal: Negative for nausea, vomiting, abdominal pain, diarrhea and blood in stool.  Genitourinary: Negative for frequency, hematuria, flank  pain and difficulty urinating.  Musculoskeletal: Negative for back pain and arthralgias.  Skin: Negative for rash.  Neurological: Negative for dizziness, speech difficulty, weakness, numbness and headaches.    Allergies  Morphine  Home Medications   Current Outpatient Rx  Name Route Sig Dispense Refill  . ASPIRIN 81 MG PO TABS Oral Take 81 mg by mouth daily.    Marland Kitchen DILTIAZEM HCL 100 MG IV SOLR Oral Take 120 mg by mouth daily.    Marland Kitchen METOPROLOL SUCCINATE ER 100 MG PO TB24 Oral Take 100 mg by mouth daily. Take with or immediately following a meal.    . ROSUVASTATIN CALCIUM 5 MG PO TABS Oral Take 5 mg by mouth daily.      BP 139/74  Pulse 70  Temp(Src) 98.1 F (36.7 C) (Oral)  Resp 20  Wt 201 lb (91.173 kg)  SpO2 100%  Physical Exam  Constitutional: She is oriented to person, place, and time. She appears well-developed and well-nourished.  HENT:  Head: Normocephalic and atraumatic.  Eyes: Pupils are equal, round, and reactive to light.  Neck: Normal range of motion. Neck supple.  Cardiovascular: Normal rate, regular rhythm and normal heart sounds.   Pulmonary/Chest: Effort normal and breath sounds normal. No respiratory distress. She has no wheezes. She has no rales. She exhibits no tenderness.  Abdominal: Soft. Bowel sounds are normal. There is no tenderness. There is no rebound and no guarding.  Musculoskeletal: Normal range of motion. She exhibits no edema.  Lymphadenopathy:    She has no cervical adenopathy.  Neurological: She is alert and oriented to person,  place, and time.  Skin: Skin is warm and dry. No rash noted.  Psychiatric: She has a normal mood and affect.    ED Course  Procedures (including critical care time)  Results for orders placed during the hospital encounter of 01/15/12  RAPID STREP SCREEN      Component Value Range   Streptococcus, Group A Screen (Direct) NEGATIVE  NEGATIVE    No results found.  No results found.   1. URI (upper respiratory  infection)       MDM  Strept neg, lungs clear.  Advised her to use guafenasin for symptomatic tx       Rolan Bucco, MD 01/15/12 [phone]

## 2012-01-15 NOTE — ED Notes (Signed)
4 days of low grade fever and chills sore throat and sinus drainage

## 2012-12-29 ENCOUNTER — Emergency Department (HOSPITAL_BASED_OUTPATIENT_CLINIC_OR_DEPARTMENT_OTHER)
Admission: EM | Admit: 2012-12-29 | Discharge: 2012-12-29 | Disposition: A | Discharge status: Home / Self Care | Payer: BC Managed Care – PPO | Attending: Emergency Medicine | Admitting: Emergency Medicine

## 2012-12-29 ENCOUNTER — Encounter (HOSPITAL_BASED_OUTPATIENT_CLINIC_OR_DEPARTMENT_OTHER): Payer: Self-pay | Admitting: *Deleted

## 2012-12-29 DIAGNOSIS — I10 Essential (primary) hypertension: Secondary | ICD-10-CM | POA: Insufficient documentation

## 2012-12-29 DIAGNOSIS — R6889 Other general symptoms and signs: Secondary | ICD-10-CM | POA: Insufficient documentation

## 2012-12-29 DIAGNOSIS — R059 Cough, unspecified: Secondary | ICD-10-CM | POA: Insufficient documentation

## 2012-12-29 DIAGNOSIS — J029 Acute pharyngitis, unspecified: Secondary | ICD-10-CM | POA: Insufficient documentation

## 2012-12-29 DIAGNOSIS — Z79899 Other long term (current) drug therapy: Secondary | ICD-10-CM | POA: Insufficient documentation

## 2012-12-29 DIAGNOSIS — J329 Chronic sinusitis, unspecified: Secondary | ICD-10-CM | POA: Insufficient documentation

## 2012-12-29 DIAGNOSIS — J3489 Other specified disorders of nose and nasal sinuses: Secondary | ICD-10-CM | POA: Insufficient documentation

## 2012-12-29 DIAGNOSIS — E7800 Pure hypercholesterolemia, unspecified: Secondary | ICD-10-CM | POA: Insufficient documentation

## 2012-12-29 HISTORY — DX: Pure hypercholesterolemia, unspecified: E78.00

## 2012-12-29 HISTORY — DX: Essential (primary) hypertension: I10

## 2012-12-29 MED ORDER — CETIRIZINE HCL 10 MG PO TBDP: 1.0000 | ORAL_TABLET | Freq: Every day | ORAL | Status: DC

## 2012-12-29 MED ORDER — LEVOFLOXACIN 500 MG PO TABS: 500.0000 mg | ORAL_TABLET | Freq: Every day | ORAL | Status: DC

## 2012-12-29 MED ORDER — OXYMETAZOLINE HCL 0.05 % NA SOLN: NASAL | Status: DC

## 2012-12-29 NOTE — ED Provider Notes (Signed)
History     CSN: [id number]  Arrival date & time 12/29/12  1211   First MD Initiated Contact with Patient 12/29/12 1301      Chief Complaint  Patient presents with  . Facial Pain    (Consider location/radiation/quality/duration/timing/severity/associated sxs/prior Treatment)  HPI Cindy Ellison is a 62 y.o. female who presents to the ED with facial pain. The pain is located around the eyes and the forehead. The pain started a few days ago. She rates the pain as 5/10. Associated symptoms include post nasal drainage, bleeding from the right nostril, nasal congestion. She denies fever, nausea, vomiting or abdominal pain. The history was provided by the patient.  Past Medical History  Diagnosis Date  . Hypertension   . Hypercholesteremia     Past Surgical History  Procedure Laterality Date  . Stents      No family history on file.  History  Substance Use Topics  . Smoking status: Never Smoker   . Smokeless tobacco: Not on file  . Alcohol Use: No    OB History   Grav Para Term Preterm Abortions TAB SAB Ect Mult Living                  Review of Systems  Constitutional: Negative for fever, chills and diaphoresis.  HENT: Positive for congestion, sore throat, rhinorrhea, sneezing and sinus pressure. Negative for ear pain, facial swelling, mouth sores, neck pain, neck stiffness and dental problem.   Eyes: Negative for photophobia, pain and discharge.  Respiratory: Positive for cough. Negative for chest tightness and wheezing.   Cardiovascular: Negative for chest pain.  Gastrointestinal: Negative for nausea, vomiting, abdominal pain, diarrhea, constipation and abdominal distention.  Genitourinary: Negative for dysuria, frequency, flank pain and difficulty urinating.  Musculoskeletal: Negative for myalgias, back pain and gait problem.  Skin: Negative for color change and rash.  Neurological: Positive for headaches. Negative for dizziness, speech difficulty, weakness,  light-headedness and numbness.  Psychiatric/Behavioral: Negative for confusion and agitation.    Allergies  Morphine and Sulfa antibiotics  Home Medications   Current Outpatient Rx  Name  Route  Sig  Dispense  Refill  . clopidogrel (PLAVIX) 75 MG tablet   Oral   Take 75 mg by mouth daily.         Marland Kitchen aspirin 81 MG tablet   Oral   Take 81 mg by mouth daily.         Marland Kitchen diltiazem (CARDIZEM) 100 MG injection   Oral   Take 120 mg by mouth daily.         . metoprolol succinate (TOPROL-XL) 100 MG 24 hr tablet   Oral   Take 100 mg by mouth daily. Take with or immediately following a meal.         . rosuvastatin (CRESTOR) 5 MG tablet   Oral   Take 5 mg by mouth daily.           BP 139/93  Pulse 99  Temp(Src) 98.9 F (37.2 C) (Oral)  Resp 18  SpO2 100%  Physical Exam  Nursing note and vitals reviewed. Constitutional: She is oriented to person, place, and time. She appears well-developed and well-nourished. No distress.  HENT:  Head: Normocephalic and atraumatic.  Right Ear: Tympanic membrane normal.  Left Ear: Tympanic membrane normal.  Nose: Mucosal edema and rhinorrhea present. Epistaxis: small amount of blood in right nostril. Right sinus exhibits maxillary sinus tenderness and frontal sinus tenderness. Left sinus exhibits maxillary sinus tenderness and frontal  sinus tenderness.  Mouth/Throat: Uvula is midline, oropharynx is clear and moist and mucous membranes are normal.  Eyes: EOM are normal. Pupils are equal, round, and reactive to light.  Neck: Neck supple. No tracheal deviation present.  Cardiovascular: Normal rate and regular rhythm.   Pulmonary/Chest: Effort normal and breath sounds normal. No respiratory distress.  Abdominal: Soft. There is no tenderness.  Musculoskeletal: Normal range of motion. She exhibits no edema.  Lymphadenopathy:    She has no cervical adenopathy.  Neurological: She is alert and oriented to person, place, and time. No cranial  nerve deficit.  Skin: Skin is warm and dry.  Psychiatric: She has a normal mood and affect. Her behavior is normal. Judgment and thought content normal.   Procedures   Assessment: 62 y.o. female with facial pain   Sinusitis  Plan:  Levaquin    Zyrtec   Afrin Discussed with the patient and all questioned fully answered. She will return if any problems arise.    Medication List    TAKE these medications       Cetirizine HCl 10 MG Tbdp  Commonly known as:  ZYRTEC ALLERGY  Take 1 tablet by mouth daily.     levofloxacin 500 MG tablet  Commonly known as:  LEVAQUIN  Take 1 tablet (500 mg total) by mouth daily.     oxymetazoline 0.05 % nasal spray  Commonly known as:  AFRIN NASAL SPRAY  Use one spray in each nostril 2 times a day for 3 days and the stop.      ASK your doctor about these medications       aspirin 81 MG tablet  Take 81 mg by mouth daily.     clopidogrel 75 MG tablet  Commonly known as:  PLAVIX  Take 75 mg by mouth daily.     diltiazem 100 MG injection  Commonly known as:  CARDIZEM  Take 120 mg by mouth daily.     metoprolol succinate 100 MG 24 hr tablet  Commonly known as:  TOPROL-XL  Take 100 mg by mouth daily. Take with or immediately following a meal.     rosuvastatin 5 MG tablet  Commonly known as:  CRESTOR  Take 5 mg by mouth daily.            [facility] Orlene Och, [place] 12/29/12 1325

## 2012-12-29 NOTE — ED Notes (Signed)
Sinus pressure, congestion, and nose bleeds.

## 2012-12-30 NOTE — ED Provider Notes (Signed)
Medical screening examination/treatment/procedure(s) were performed by non-physician practitioner and as supervising physician I was immediately available for consultation/collaboration.    Brian D Miller, MD 12/30/12 0748 

## 2014-06-29 ENCOUNTER — Emergency Department (HOSPITAL_BASED_OUTPATIENT_CLINIC_OR_DEPARTMENT_OTHER)
Admission: EM | Admit: 2014-06-29 | Discharge: 2014-06-29 | Disposition: A | Discharge status: Home / Self Care | Payer: BC Managed Care – PPO | Attending: Emergency Medicine | Admitting: Emergency Medicine

## 2014-06-29 ENCOUNTER — Emergency Department (HOSPITAL_BASED_OUTPATIENT_CLINIC_OR_DEPARTMENT_OTHER): Payer: BC Managed Care – PPO

## 2014-06-29 ENCOUNTER — Encounter (HOSPITAL_BASED_OUTPATIENT_CLINIC_OR_DEPARTMENT_OTHER): Payer: Self-pay | Admitting: Emergency Medicine

## 2014-06-29 DIAGNOSIS — Z79899 Other long term (current) drug therapy: Secondary | ICD-10-CM | POA: Diagnosis not present

## 2014-06-29 DIAGNOSIS — Z7982 Long term (current) use of aspirin: Secondary | ICD-10-CM | POA: Insufficient documentation

## 2014-06-29 DIAGNOSIS — R42 Dizziness and giddiness: Secondary | ICD-10-CM | POA: Diagnosis present

## 2014-06-29 DIAGNOSIS — I10 Essential (primary) hypertension: Secondary | ICD-10-CM | POA: Diagnosis not present

## 2014-06-29 DIAGNOSIS — Z7902 Long term (current) use of antithrombotics/antiplatelets: Secondary | ICD-10-CM | POA: Insufficient documentation

## 2014-06-29 DIAGNOSIS — E7800 Pure hypercholesterolemia, unspecified: Secondary | ICD-10-CM | POA: Insufficient documentation

## 2014-06-29 LAB — COMPREHENSIVE METABOLIC PANEL
ALBUMIN: 4 g/dL (ref 3.5–5.2)
ALK PHOS: 138 U/L — AB (ref 39–117)
ALT: 12 U/L (ref 0–35)
ANION GAP: 11 (ref 5–15)
AST: 19 U/L (ref 0–37)
BUN: 10 mg/dL (ref 6–23)
CHLORIDE: 103 meq/L (ref 96–112)
CO2: 29 meq/L (ref 19–32)
CREATININE: 0.9 mg/dL (ref 0.50–1.10)
Calcium: 10.2 mg/dL (ref 8.4–10.5)
GFR, EST AFRICAN AMERICAN: 77 mL/min — AB (ref >90)
GFR, EST NON AFRICAN AMERICAN: 67 mL/min — AB (ref >90)
GLUCOSE: 80 mg/dL (ref 70–99)
POTASSIUM: 3.9 meq/L (ref 3.7–5.3)
SODIUM: 143 meq/L (ref 137–147)
TOTAL PROTEIN: 8.1 g/dL (ref 6.0–8.3)
Total Bilirubin: 0.4 mg/dL (ref 0.3–1.2)

## 2014-06-29 LAB — CBC WITH DIFFERENTIAL/PLATELET
Basophils Absolute: 0 10*3/uL (ref 0.0–0.1)
Basophils Relative: 0 % (ref 0–1)
Eosinophils Absolute: 0.1 10*3/uL (ref 0.0–0.7)
Eosinophils Relative: 2 % (ref 0–5)
HCT: 39.5 % (ref 36.0–46.0)
HEMOGLOBIN: 12.7 g/dL (ref 12.0–15.0)
LYMPHS ABS: 2.4 10*3/uL (ref 0.7–4.0)
Lymphocytes Relative: 52 % — ABNORMAL HIGH (ref 12–46)
MCH: 26 pg (ref 26.0–34.0)
MCHC: 32.2 g/dL (ref 30.0–36.0)
MCV: 80.9 fL (ref 78.0–100.0)
Monocytes Absolute: 0.7 10*3/uL (ref 0.1–1.0)
Monocytes Relative: 15 % — ABNORMAL HIGH (ref 3–12)
NEUTROS ABS: 1.4 10*3/uL — AB (ref 1.7–7.7)
Neutrophils Relative %: 31 % — ABNORMAL LOW (ref 43–77)
PLATELETS: 207 10*3/uL (ref 150–400)
RBC: 4.88 MIL/uL (ref 3.87–5.11)
RDW: 16 % — ABNORMAL HIGH (ref 11.5–15.5)
WBC: 4.5 10*3/uL (ref 4.0–10.5)

## 2014-06-29 MED ORDER — MECLIZINE HCL 25 MG PO TABS: 25.0000 mg | ORAL_TABLET | Freq: Three times a day (TID) | ORAL | Status: DC | PRN

## 2014-06-29 MED ORDER — METOCLOPRAMIDE HCL 5 MG/ML IJ SOLN: 10.0000 mg | Freq: Once | INTRAMUSCULAR | Status: DC

## 2014-06-29 MED ORDER — DIPHENHYDRAMINE HCL 50 MG/ML IJ SOLN: 25.0000 mg | Freq: Once | INTRAMUSCULAR | Status: DC

## 2014-06-29 MED ORDER — SODIUM CHLORIDE 0.9 % IV BOLUS (SEPSIS): 1000.0000 mL | Freq: Once | INTRAVENOUS | Status: DC

## 2014-06-29 MED ORDER — MECLIZINE HCL 25 MG PO TABS
25.0000 mg | ORAL_TABLET | Freq: Once | ORAL | Status: AC
  Administered 2014-06-29: 25 mg via ORAL
  Filled 2014-06-29: qty 1

## 2014-06-29 NOTE — ED Provider Notes (Signed)
CSN: [id number]     Arrival date & time 06/29/14  1225 History   First MD Initiated Contact with Patient 06/29/14 1310     Chief Complaint  Patient presents with  . Dizziness     (Consider location/radiation/quality/duration/timing/severity/associated sxs/prior Treatment) The history is provided by the patient.  Cindy Ellison is a 63 y.o. female hx of HTN, HL here with dizziness. Acute onset of dizziness and vertigo this morning. She was walking and felt that she was going to fall on the right side. Also feels that the room was spinning. Denies weakness. Never had vertigo in the past. Denies numbness or trouble speaking.    Past Medical History  Diagnosis Date  . Hypertension   . Hypercholesteremia    Past Surgical History  Procedure Laterality Date  . Stents     No family history on file. History  Substance Use Topics  . Smoking status: Never Smoker   . Smokeless tobacco: Not on file  . Alcohol Use: No   OB History   Grav Para Term Preterm Abortions TAB SAB Ect Mult Living                 Review of Systems  Neurological: Positive for dizziness.  All other systems reviewed and are negative.     Allergies  Morphine and Sulfa antibiotics  Home Medications   Prior to Admission medications   Medication Sig Start Date End Date Taking? Authorizing Provider  aspirin 81 MG tablet Take 81 mg by mouth daily.    Historical Provider, MD  Cetirizine HCl (ZYRTEC ALLERGY) 10 MG TBDP Take 1 tablet by mouth daily. 12/29/12   Hope Orlene Och, NP  clopidogrel (PLAVIX) 75 MG tablet Take 75 mg by mouth daily.    Historical Provider, MD  diltiazem (CARDIZEM) 100 MG injection Take 120 mg by mouth daily.    Historical Provider, MD  levofloxacin (LEVAQUIN) 500 MG tablet Take 1 tablet (500 mg total) by mouth daily. 12/29/12   Hope Orlene Och, NP  metoprolol succinate (TOPROL-XL) 100 MG 24 hr tablet Take 100 mg by mouth daily. Take with or immediately following a meal.    Historical Provider, MD   oxymetazoline (AFRIN NASAL SPRAY) 0.05 % nasal spray Use one spray in each nostril 2 times a day for 3 days and the stop. 12/29/12   Hope Orlene Och, NP  rosuvastatin (CRESTOR) 5 MG tablet Take 5 mg by mouth daily.    Historical Provider, MD   BP 156/97  Pulse 62  Temp(Src) 98.6 F (37 C) (Oral)  Resp 16  Ht  (1.651 m)  Wt 208 lb (94.348 kg)  BMI 34.61 kg/m2  SpO2 99% Physical Exam  Nursing note and vitals reviewed. Constitutional: She is oriented to person, place, and time. She appears well-developed and well-nourished.  HENT:  Head: Normocephalic.  Mouth/Throat: Oropharynx is clear and moist.  Eyes: Conjunctivae and EOM are normal. Pupils are equal, round, and reactive to light.  ? L nystagmus, no changes in direction. HINTS exam unremarkable.   Neck: Normal range of motion. Neck supple.  Cardiovascular: Normal rate, regular rhythm and normal heart sounds.   Pulmonary/Chest: Effort normal and breath sounds normal. No respiratory distress. She has no wheezes. She has no rales.  Abdominal: Soft. Bowel sounds are normal. She exhibits no distension. There is no tenderness. There is no rebound.  Musculoskeletal: Normal range of motion. She exhibits no edema and no tenderness.  Neurological: She is alert and oriented to  person, place, and time.  CN 2-12 intact. Nl strength throughout. Nl finger to nose. Neg pronator drift. ? Rhomberg, slightly unsteady with gait but not falling   Skin: Skin is warm and dry.  Psychiatric: She has a normal mood and affect. Her behavior is normal. Judgment and thought content normal.    ED Course  Procedures (including critical care time) Labs Review Labs Reviewed  CBC WITH DIFFERENTIAL - Abnormal; Notable for the following:    RDW 16.0 (*)    Neutrophils Relative % 31 (*)    Neutro Abs 1.4 (*)    Lymphocytes Relative 52 (*)    Monocytes Relative 15 (*)    All other components within normal limits  COMPREHENSIVE METABOLIC PANEL - Abnormal;  Notable for the following:    Alkaline Phosphatase 138 (*)    GFR calc non Af Amer 67 (*)    GFR calc Af Amer 77 (*)    All other components within normal limits    Imaging Review Ct Head Wo Contrast  06/29/2014   CLINICAL DATA:  Dizziness. Sensation of falling. Pain in left ear several days ago.  EXAM: CT HEAD WITHOUT CONTRAST  TECHNIQUE: Contiguous axial images were obtained from the base of the skull through the vertex without intravenous contrast.  COMPARISON:  Report from 07/04/2003  FINDINGS: The brainstem, cerebellum, cerebral peduncles, thalamus, basal ganglia, basilar cisterns, and ventricular system appear within normal limits. Periventricular white matter and corona radiata hypodensities favor chronic ischemic microvascular white matter disease. No intracranial hemorrhage, mass lesion, or acute CVA. Visualized paranasal sinuses clear. No middle ear fluid observed. The mastoid air cells appear clear. Symmetric internal auditory canals.  IMPRESSION: 1. A cause for the patient's symptoms is not identified by CT. 2. Periventricular white matter and corona radiata hypodensities favor chronic ischemic microvascular white matter disease.   Electronically Signed   By: Herbie Baltimore M.D.   On: 06/29/2014 13:38     EKG Interpretation None      MDM   Final diagnoses:  None    Cindy Ellison is a 63 y.o. female here with vertigo. HINTS exam neg. Other than ? rhomberg I don't see any neurologic deficits. Will get CT head and give meclizine and reassess.   2:49 PM After meclizine, felt better. Neg rhomberg, steady with gait. CT head unremarkable. I doubt stroke. Will d/c with meclizine.   Richardean Canal, MD 06/29/14 [phone]

## 2014-06-29 NOTE — Discharge Instructions (Signed)
Take meclizine as needed for dizziness.   Follow up with your doctor.   Return to ER if you have worse dizziness, vomiting, unsteady gait.

## 2014-06-29 NOTE — ED Notes (Signed)
Dizziness after waking this am. Feels like she is falling. Pain in her left ear a few days ago. Hx of inner ear.

## 2014-10-24 ENCOUNTER — Emergency Department (HOSPITAL_BASED_OUTPATIENT_CLINIC_OR_DEPARTMENT_OTHER)
Admission: EM | Admit: 2014-10-24 | Discharge: 2014-10-24 | Disposition: A | Discharge status: Home / Self Care | Payer: BC Managed Care – PPO | Attending: Emergency Medicine | Admitting: Emergency Medicine

## 2014-10-24 ENCOUNTER — Encounter (HOSPITAL_BASED_OUTPATIENT_CLINIC_OR_DEPARTMENT_OTHER): Payer: Self-pay | Admitting: Emergency Medicine

## 2014-10-24 DIAGNOSIS — M25511 Pain in right shoulder: Secondary | ICD-10-CM

## 2014-10-24 DIAGNOSIS — Z7982 Long term (current) use of aspirin: Secondary | ICD-10-CM | POA: Diagnosis not present

## 2014-10-24 DIAGNOSIS — N390 Urinary tract infection, site not specified: Secondary | ICD-10-CM | POA: Insufficient documentation

## 2014-10-24 DIAGNOSIS — I10 Essential (primary) hypertension: Secondary | ICD-10-CM | POA: Diagnosis not present

## 2014-10-24 DIAGNOSIS — Z7902 Long term (current) use of antithrombotics/antiplatelets: Secondary | ICD-10-CM | POA: Insufficient documentation

## 2014-10-24 DIAGNOSIS — Z792 Long term (current) use of antibiotics: Secondary | ICD-10-CM | POA: Insufficient documentation

## 2014-10-24 DIAGNOSIS — M791 Myalgia: Secondary | ICD-10-CM | POA: Diagnosis not present

## 2014-10-24 DIAGNOSIS — E780 Pure hypercholesterolemia: Secondary | ICD-10-CM | POA: Diagnosis not present

## 2014-10-24 DIAGNOSIS — Z79899 Other long term (current) drug therapy: Secondary | ICD-10-CM | POA: Insufficient documentation

## 2014-10-24 LAB — URINALYSIS, ROUTINE W REFLEX MICROSCOPIC
Bilirubin Urine: NEGATIVE
Glucose, UA: NEGATIVE mg/dL
Ketones, ur: NEGATIVE mg/dL
NITRITE: NEGATIVE
PH: 6 (ref 5.0–8.0)
Protein, ur: NEGATIVE mg/dL
SPECIFIC GRAVITY, URINE: 1.015 (ref 1.005–1.030)
Urobilinogen, UA: 0.2 mg/dL (ref 0.0–1.0)

## 2014-10-24 LAB — URINE MICROSCOPIC-ADD ON

## 2014-10-24 MED ORDER — METHOCARBAMOL 500 MG PO TABS: 500.0000 mg | ORAL_TABLET | Freq: Two times a day (BID) | ORAL | Status: DC

## 2014-10-24 MED ORDER — CEPHALEXIN 500 MG PO CAPS: 500.0000 mg | ORAL_CAPSULE | Freq: Four times a day (QID) | ORAL | Status: DC

## 2014-10-24 NOTE — ED Notes (Signed)
PT presents to ED with complaints of right shoulder pain and right arm pain for the past 2 days. Pt states she was unable to sleep last night and used a muscle rub that helped some. PT states she was here for same a few months ago and was given muscle relaxer that helped but states she pain is more intense this time.

## 2014-10-24 NOTE — ED Provider Notes (Signed)
CSN: [id number]     Arrival date & time 10/24/14  1105 History   First MD Initiated Contact with Patient 10/24/14 1201     Chief Complaint  Patient presents with  . Shoulder Pain     (Consider location/radiation/quality/duration/timing/severity/associated sxs/prior Treatment) Patient is a 63 y.o. female presenting with shoulder pain. The history is provided by the patient and medical records.  Shoulder Pain   This is a 63 y.o. F with PMH significant for HTN, HLP, presenting to the ED for right shoulder pain.  No recent injury, trauma, or falls. Patient states pain began 2 days ago, localized to her posterior right shoulder without radiation. States pain is worse with movement, described as an intense "ache".  Patient states she has used an over-the-counter muscle rub which she states provided some relief. Patient states she does have a pinched nerve in her neck, unsure if pain is related to this. States she was seen here a few months ago for similar pain, given a muscle relaxer which helped. Patient denies any numbness or paresthesias of right arm. No chest pain, shortness breath, palpitations, dizziness, or weakness.  VS stable on arrival.  Past Medical History  Diagnosis Date  . Hypertension   . Hypercholesteremia    Past Surgical History  Procedure Laterality Date  . Stents     No family history on file. History  Substance Use Topics  . Smoking status: Never Smoker   . Smokeless tobacco: Not on file  . Alcohol Use: No   OB History    No data available     Review of Systems  Musculoskeletal: Positive for myalgias and arthralgias.  All other systems reviewed and are negative.     Allergies  Morphine and Sulfa antibiotics  Home Medications   Prior to Admission medications   Medication Sig Start Date End Date Taking? Authorizing Provider  aspirin 81 MG tablet Take 81 mg by mouth daily.   Yes Historical Provider, MD  clopidogrel (PLAVIX) 75 MG tablet Take 75 mg by  mouth daily.   Yes Historical Provider, MD  diltiazem (CARDIZEM) 100 MG injection Take 120 mg by mouth daily.   Yes Historical Provider, MD  metoprolol succinate (TOPROL-XL) 100 MG 24 hr tablet Take 100 mg by mouth daily. Take with or immediately following a meal.   Yes Historical Provider, MD  rosuvastatin (CRESTOR) 5 MG tablet Take 5 mg by mouth daily.   Yes Historical Provider, MD  Cetirizine HCl (ZYRTEC ALLERGY) 10 MG TBDP Take 1 tablet by mouth daily. 12/29/12   Hope Orlene OchM Neese, NP  levofloxacin (LEVAQUIN) 500 MG tablet Take 1 tablet (500 mg total) by mouth daily. 12/29/12   Hope Orlene OchM Neese, NP  meclizine (ANTIVERT) 25 MG tablet Take 1 tablet (25 mg total) by mouth 3 (three) times daily as needed for dizziness. 06/29/14   Richardean Canalavid H Yao, MD  oxymetazoline [facility](AFRIN NASAL SPRAY) 0.05 % nasal spray Use one spray in each nostril 2 times a day for 3 days and the stop. 12/29/12   Hope Orlene OchM Neese, NP   BP 154/90 mmHg  Pulse 74  Temp(Src) 98.6 F (37 C) (Oral)  Resp 18  Ht 5\' 5"  (1.651 m)  Wt 213 lb (96.616 kg)  BMI 35.44 kg/m2  SpO2 99%   Physical Exam  Constitutional: She is oriented to person, place, and time. She appears well-developed and well-nourished. No distress.  HENT:  Head: Normocephalic and atraumatic.  Mouth/Throat: Oropharynx is clear and moist.  Eyes: Conjunctivae  and EOM are normal. Pupils are equal, round, and reactive to light.  Neck: Normal range of motion.  Cardiovascular: Normal rate, regular rhythm and normal heart sounds.   Pulmonary/Chest: Effort normal and breath sounds normal. No respiratory distress. She has no wheezes.  Abdominal: Soft. Bowel sounds are normal. There is no tenderness. There is no guarding and no CVA tenderness.  Musculoskeletal: Normal range of motion.       Right shoulder: She exhibits tenderness, pain and spasm. She exhibits normal range of motion, no bony tenderness, no swelling and no deformity.  Right shoulder normal in appearance without bony deformity or  swelling, no visible signs of trauma; noted spasm of right trapezius with reproducible pain; full range of motion maintained; normal grip strength; arm is neurovascularly intact  Neurological: She is alert and oriented to person, place, and time.  Skin: Skin is warm and dry. She is not diaphoretic.  Psychiatric: She has a normal mood and affect.  Nursing note and vitals reviewed.   ED Course  Procedures (including critical care time) Labs Review Labs Reviewed  URINALYSIS, ROUTINE W REFLEX MICROSCOPIC - Abnormal; Notable for the following:    APPearance CLOUDY (*)    Hgb urine dipstick LARGE (*)    Leukocytes, UA TRACE (*)    All other components within normal limits  URINE MICROSCOPIC-ADD ON - Abnormal; Notable for the following:    Squamous Epithelial / LPF FEW (*)    Bacteria, UA MANY (*)    All other components within normal limits  URINE CULTURE    Imaging Review No results found.   EKG Interpretation None      MDM   Final diagnoses:  Right shoulder pain  UTI (lower urinary tract infection)   63 y.o. F with right shoulder pain.  On exam, spasm of right trapezius.  Pain reproducible with palpation of this area.  No chest pain, SOB, palpitations, dizziness, or weakness.  VS stable.  Feel that symptoms are mostly likely MSK in nature given palpable muscle spasm and reproducible nature of pain, lower suspicion for ACS, PE, dissection, or other acute cardiac event.  U/a was obtained in triage, patient without current urinary symptoms but states she has been "dribbling" a little more than usual lately, but no loss of bowel/bladder control.  Will start on keflex, urine sent for culture.  Rx robaxin for muscle spasms.  Encouraged close FU with PCP in the next few days.  Discussed plan with patient, he/she acknowledged understanding and agreed with plan of care.  Return precautions given for new or worsening symptoms.  Garlon HatchetLisa M Jadasia Haws, PA-C 10/24/14 1512  Joya Gaskinsonald W Wickline,  MD 10/24/14 [phone]

## 2014-10-24 NOTE — Discharge Instructions (Signed)
Take the prescribed medication as directed.  May wish to apply heat to right shoulder for added relief. Follow-up with your primary care physician. Return to the ED for new or worsening symptoms.

## 2014-10-25 LAB — URINE CULTURE: Colony Count: 40000

## 2014-10-30 HISTORY — PX: TUMOR REMOVAL: SHX12

## 2015-04-04 ENCOUNTER — Emergency Department (HOSPITAL_BASED_OUTPATIENT_CLINIC_OR_DEPARTMENT_OTHER)
Admission: EM | Admit: 2015-04-04 | Discharge: 2015-04-04 | Disposition: A | Discharge status: Home / Self Care | Payer: BC Managed Care – PPO | Attending: Emergency Medicine | Admitting: Emergency Medicine

## 2015-04-04 ENCOUNTER — Encounter (HOSPITAL_BASED_OUTPATIENT_CLINIC_OR_DEPARTMENT_OTHER): Payer: Self-pay | Admitting: *Deleted

## 2015-04-04 ENCOUNTER — Emergency Department (HOSPITAL_BASED_OUTPATIENT_CLINIC_OR_DEPARTMENT_OTHER): Payer: BC Managed Care – PPO

## 2015-04-04 DIAGNOSIS — S92512A Displaced fracture of proximal phalanx of left lesser toe(s), initial encounter for closed fracture: Secondary | ICD-10-CM | POA: Diagnosis not present

## 2015-04-04 DIAGNOSIS — Y9389 Activity, other specified: Secondary | ICD-10-CM | POA: Diagnosis not present

## 2015-04-04 DIAGNOSIS — Z7902 Long term (current) use of antithrombotics/antiplatelets: Secondary | ICD-10-CM | POA: Diagnosis not present

## 2015-04-04 DIAGNOSIS — Z792 Long term (current) use of antibiotics: Secondary | ICD-10-CM | POA: Insufficient documentation

## 2015-04-04 DIAGNOSIS — S92912A Unspecified fracture of left toe(s), initial encounter for closed fracture: Secondary | ICD-10-CM

## 2015-04-04 DIAGNOSIS — E780 Pure hypercholesterolemia: Secondary | ICD-10-CM | POA: Insufficient documentation

## 2015-04-04 DIAGNOSIS — Y9289 Other specified places as the place of occurrence of the external cause: Secondary | ICD-10-CM | POA: Insufficient documentation

## 2015-04-04 DIAGNOSIS — W2203XA Walked into furniture, initial encounter: Secondary | ICD-10-CM | POA: Diagnosis not present

## 2015-04-04 DIAGNOSIS — I10 Essential (primary) hypertension: Secondary | ICD-10-CM | POA: Insufficient documentation

## 2015-04-04 DIAGNOSIS — Y998 Other external cause status: Secondary | ICD-10-CM | POA: Insufficient documentation

## 2015-04-04 DIAGNOSIS — Z7982 Long term (current) use of aspirin: Secondary | ICD-10-CM | POA: Insufficient documentation

## 2015-04-04 DIAGNOSIS — S99922A Unspecified injury of left foot, initial encounter: Secondary | ICD-10-CM | POA: Diagnosis present

## 2015-04-04 DIAGNOSIS — Z79899 Other long term (current) drug therapy: Secondary | ICD-10-CM | POA: Diagnosis not present

## 2015-04-04 NOTE — ED Notes (Signed)
Patient transported to X-ray 

## 2015-04-04 NOTE — ED Notes (Signed)
Pt c/o left 4th toe injury x 1 day ago

## 2015-04-04 NOTE — Discharge Instructions (Signed)

## 2015-04-04 NOTE — ED Provider Notes (Signed)
CSN: [id number]     Arrival date & time 04/04/15  [id number] History   First MD Initiated Contact with Patient 04/04/15 [phone]     Chief Complaint  Patient presents with  . Toe Injury     (Consider location/radiation/quality/duration/timing/severity/associated sxs/prior Treatment) HPI Comments: Stubbed her 4th toe on the L on a chair 1 day ago. No laceration. Patient still walking, but limping. No relief with meds.  Patient is a 64 y.o. female presenting with lower extremity pain. The history is provided by the patient.  Foot Pain This is a new problem. The current episode started yesterday. The problem occurs constantly. The problem has not changed since onset.Pertinent negatives include no abdominal pain and no shortness of breath. Nothing aggravates the symptoms. Nothing relieves the symptoms.    Past Medical History  Diagnosis Date  . Hypertension   . Hypercholesteremia    Past Surgical History  Procedure Laterality Date  . Stents     History reviewed. No pertinent family history. History  Substance Use Topics  . Smoking status: Never Smoker   . Smokeless tobacco: Not on file  . Alcohol Use: No   OB History    No data available     Review of Systems  Constitutional: Negative for fever.  Respiratory: Negative for cough and shortness of breath.   Gastrointestinal: Negative for vomiting and abdominal pain.  All other systems reviewed and are negative.     Allergies  Morphine and Sulfa antibiotics  Home Medications   Prior to Admission medications   Medication Sig Start Date End Date Taking? Authorizing Provider  aspirin 81 MG tablet Take 81 mg by mouth daily.    Historical Provider, MD  cephALEXin (KEFLEX) 500 MG capsule Take 1 capsule (500 mg total) by mouth 4 (four) times daily. 10/24/14   Garlon HatchetLisa M Sanders, PA-C  Cetirizine HCl (ZYRTEC ALLERGY) 10 MG TBDP Take 1 tablet by mouth daily. 12/29/12   Hope Orlene OchM Neese, NP  clopidogrel (PLAVIX) 75 MG tablet Take 75 mg by mouth  daily.    Historical Provider, MD  diltiazem (CARDIZEM) 100 MG injection Take 120 mg by mouth daily.    Historical Provider, MD  levofloxacin (LEVAQUIN) 500 MG tablet Take 1 tablet (500 mg total) by mouth daily. 12/29/12   Hope Orlene OchM Neese, NP  meclizine (ANTIVERT) 25 MG tablet Take 1 tablet (25 mg total) by mouth 3 (three) times daily as needed for dizziness. 06/29/14   Richardean Canalavid H Yao, MD  methocarbamol (ROBAXIN) 500 MG tablet Take 1 tablet (500 mg total) by mouth 2 (two) times daily. 10/24/14   Garlon HatchetLisa M Sanders, PA-C  metoprolol succinate (TOPROL-XL) 100 MG 24 hr tablet Take 100 mg by mouth daily. Take with or immediately following a meal.    Historical Provider, MD  oxymetazoline (AFRIN NASAL SPRAY) 0.05 % nasal spray Use one spray in each nostril 2 times a day for 3 days and the stop. 12/29/12   Hope Orlene OchM Neese, NP  rosuvastatin (CRESTOR) 5 MG tablet Take 5 mg by mouth daily.    Historical Provider, MD   BP 154/75 mmHg  Pulse 79  Temp(Src) 99 F (37.2 C) (Oral)  Resp 20  Ht 5\' 5"  (1.651 m)  Wt 214 lb (97.07 kg)  BMI 35.61 kg/m2  SpO2 98% Physical Exam  Constitutional: She is oriented to person, place, and time. She appears well-developed and well-nourished. No distress.  HENT:  Head: Normocephalic and atraumatic.  Mouth/Throat: Oropharynx is clear and moist.  Eyes:  EOM are normal. Pupils are equal, round, and reactive to light.  Neck: Normal range of motion. Neck supple.  Cardiovascular: Normal rate and regular rhythm.  Exam reveals no friction rub.   No murmur heard. Pulmonary/Chest: Effort normal and breath sounds normal. No respiratory distress. She has no wheezes. She has no rales.  Abdominal: Soft. She exhibits no distension. There is no tenderness. There is no rebound.  Musculoskeletal: Normal range of motion. She exhibits no edema.       Feet:  Neurological: She is alert and oriented to person, place, and time. No cranial nerve deficit. She exhibits normal muscle tone. Coordination normal.    Skin: No rash noted. She is not diaphoretic.  Nursing note and vitals reviewed.   ED Course  Procedures (including critical care time) Labs Review Labs Reviewed - No data to display  Imaging Review Dg Toe 4th Left  04/04/2015   CLINICAL DATA:  Stump foot on chair.  EXAM: LEFT FOURTH TOE  COMPARISON:  None.  FINDINGS: There is oblique fracture through the proximal phalanx of the fourth digit with mild lateral displacement by 1 mm.  IMPRESSION: Fracture the midshaft proximal phalanx fourth digit.   Electronically Signed   By: Genevive Bi M.D.   On: 04/04/2015 09:37     EKG Interpretation None      MDM   Final diagnoses:  Fracture, toe, left, closed, initial encounter    72F here with toe injury. She stubbed her 4th toe on a chair yesterday. Limping. Mild swelling and TTP on her left 4th toe. Xray shows broken proximal phalanx of her L 4th toe. Post-op shoe given. Given Ortho f/u. I have reviewed all labs and imaging and considered them in my medical decision making.    Elwin Mocha, MD 04/04/15 [phone]

## 2019-06-05 ENCOUNTER — Telehealth: Payer: Self-pay | Admitting: *Deleted

## 2019-06-05 NOTE — Telephone Encounter (Signed)
Call received from patient stating that she had a missed call from this office yesterday.  Pt informed that no calls were documented from this office for patient.  Pt appreciate of information.

## 2019-06-10 ENCOUNTER — Ambulatory Visit (INDEPENDENT_AMBULATORY_CARE_PROVIDER_SITE_OTHER): Payer: Medicare Other | Admitting: Pediatrics

## 2019-06-10 ENCOUNTER — Encounter: Payer: Self-pay | Admitting: Pediatrics

## 2019-06-10 ENCOUNTER — Other Ambulatory Visit: Payer: Self-pay

## 2019-06-10 VITALS — BP 138/78 | HR 80 | Temp 98.9°F | Resp 18 | Ht 65.6 in | Wt 229.4 lb

## 2019-06-10 DIAGNOSIS — Z79899 Other long term (current) drug therapy: Secondary | ICD-10-CM | POA: Diagnosis not present

## 2019-06-10 DIAGNOSIS — T7800XD Anaphylactic reaction due to unspecified food, subsequent encounter: Secondary | ICD-10-CM | POA: Diagnosis not present

## 2019-06-10 DIAGNOSIS — I10 Essential (primary) hypertension: Secondary | ICD-10-CM

## 2019-06-10 DIAGNOSIS — J301 Allergic rhinitis due to pollen: Secondary | ICD-10-CM | POA: Diagnosis not present

## 2019-06-10 MED ORDER — FLUTICASONE PROPIONATE 50 MCG/ACT NA SUSP: NASAL | 5 refills | Status: DC

## 2019-06-10 MED ORDER — EPINEPHRINE 0.3 MG/0.3ML IJ SOAJ: 0.3000 mg | Freq: Once | INTRAMUSCULAR | 1 refills | Status: AC

## 2019-06-10 NOTE — Patient Instructions (Addendum)
Environmental control of dust and mold Zyrtec 10 mg-take 1 tablet once a day if needed for runny nose or itchy eyes Fluticasone 2 sprays per nostril once a day if needed for stuffy nose  Avoid fish.  If you have an allergic reaction take Benadryl 50 mg every 4 hours and if you have life-threatening symptoms inject with EpiPen 0.3 mg I will call you with your blood work for fish allergy to see which fish we could add to your diet  Continue on  your other medications Call us if you are not doing well on this treatment plan

## 2019-06-10 NOTE — Progress Notes (Signed)
[address] [place] [address] Dept: [phone]  New Patient Note  Patient ID: Cindy Ellison, female    DOB: 08/08/1951  Age: 68 y.o. MRN: [id number] Date of Office Visit: 06/10/2019 Referring provider: No referring provider defined for this encounter.    Chief Complaint: Allergic Reaction  HPI Cindy Ellison presents for evaluation of an allergic reaction and allergic rhinitis.  Two months ago after eating fried fish she developed hives with itching and swelling soon after eating the fish.  She has not had previous allergic reactions.  She did not recall what fish  she had to eat.  She has never had eczema or chronic urticaria.  She has not had asthmatic symptoms.  She has a history of seasonal allergic rhinitis for several years.  She has aggravation of her symptoms on exposure to dust and  the springtime of the year.  She has had high blood pressure and tachycardia and needed 3 stents placed 4 years ago.  Occasionally she has heartburn and takes over-the-counter medications  Review of Systems  Constitutional: Negative.   HENT:       Seasonal allergic rhinitis for several years .  Removal of an early growth in the back of her mouth  Eyes:       Early cataracts  Respiratory: Negative.   Cardiovascular:       Hypertension.  History of 3 stents 4 years ago  Gastrointestinal:       Occasional heartburn  Genitourinary: Negative.   Musculoskeletal:       Osteoarthritis  Skin:       Hives after eating fried fish 2 months ago  Neurological:       Pinched nerve in the right side of her  neck  Endo/Heme/Allergies:       No diabetes or thyroid disease  Psychiatric/Behavioral: Negative.     Outpatient Encounter Medications as of 06/10/2019  Medication Sig  . aspirin 81 MG tablet Take 81 mg by mouth daily.  . Cetirizine HCl (ZYRTEC ALLERGY) 10 MG TBDP Take 1 tablet by mouth daily.  Marland Kitchen diltiazem (CARDIZEM CD) 120 MG 24 hr capsule TAKE ONE CAPSULE BY MOUTH DAILY  . diltiazem  (CARDIZEM) 100 MG injection Take 120 mg by mouth daily.  . ferrous sulfate 325 (65 FE) MG tablet Take by mouth 3 (three) times a week.  . fluticasone (FLONASE) 50 MCG/ACT nasal spray as needed.  . isosorbide mononitrate (IMDUR) 60 MG 24 hr tablet TAKE 1 TABLET BY MOUTH DAILY  . metoprolol succinate (TOPROL-XL) 100 MG 24 hr tablet Take 100 mg by mouth daily. Take with or immediately following a meal.  . nitroGLYCERIN (NITROSTAT) 0.4 MG SL tablet as needed.  . potassium chloride (K-DUR) 10 MEQ tablet TAKE 1 TABLET(10 MEQ) BY MOUTH DAILY  . rosuvastatin (CRESTOR) 5 MG tablet Take 5 mg by mouth daily.  Marland Kitchen zolpidem (AMBIEN) 5 MG tablet as needed.  Marland Kitchen EPINEPHrine 0.3 mg/0.3 mL IJ SOAJ injection Inject 0.3 mLs (0.3 mg total) into the muscle once for 1 dose.  . fluticasone (FLONASE) 50 MCG/ACT nasal spray 2 sprays per nostril once a day if needed for stuffy nose  . [DISCONTINUED] cephALEXin (KEFLEX) 500 MG capsule Take 1 capsule (500 mg total) by mouth 4 (four) times daily.  . [DISCONTINUED] clopidogrel (PLAVIX) 75 MG tablet Take 75 mg by mouth daily.  . [DISCONTINUED] levofloxacin (LEVAQUIN) 500 MG tablet Take 1 tablet (500 mg total) by mouth daily.  . [DISCONTINUED] meclizine (ANTIVERT) 25 MG tablet  Take 1 tablet (25 mg total) by mouth 3 (three) times daily as needed for dizziness.  . [DISCONTINUED] methocarbamol (ROBAXIN) 500 MG tablet Take 1 tablet (500 mg total) by mouth 2 (two) times daily.  . [DISCONTINUED] oxymetazoline (AFRIN NASAL SPRAY) 0.05 % nasal spray Use one spray in each nostril 2 times a day for 3 days and the stop.   No facility-administered encounter medications on file as of 06/10/2019.      Drug Allergies:  Allergies  Allergen Reactions  . Morphine   . Sulfa Antibiotics     Family History: Consetta's family history includes Asthma in her sister..  Family history is positive for  eczema and urticaria.  Family history is negative for hayfever, sinus problems, angioedema, food  allergies, lupus.  Social and environmental.  There are no pets in the home.  She is not exposed to cigarette smoking.  She quit smoking 30 years ago after smoking about 2 packs/week of cigarettes for 10 years  Physical Exam: BP 138/78   Pulse 80   Temp 98.9 F (37.2 C) (Oral)   Resp 18   Ht 5' 5.6" (1.666 m)   Wt 229 lb 6.4 oz (104.1 kg)   SpO2 96%   BMI 37.48 kg/m    Physical Exam Constitutional:      Appearance: Normal appearance. She is normal weight.  HENT:     Head:     Comments: Eyes normal.  Ears normal.  Nose normal.  Pharynx normal. Neck:     Musculoskeletal: Neck supple.     Comments: No thyromegaly Cardiovascular:     Comments: S1-S2 normal no murmur Pulmonary:     Comments: Clear to percussion and auscultation Lymphadenopathy:     Cervical: No cervical adenopathy.  Skin:    Comments: Clear  Neurological:     General: No focal deficit present.     Mental Status: She is alert and oriented to person, place, and time. Mental status is at baseline.  Psychiatric:        Mood and Affect: Mood normal.        Behavior: Behavior normal.        Thought Content: Thought content normal.        Judgment: Judgment normal.     Diagnostics: Allergy skin test were positive to some molds, grass pollen, weed pollen , cat and cockroach.  She also showed some reactivity to codfish   Assessment  Assessment and Plan: 1. Allergy with anaphylaxis due to food, subsequent encounter   2. Seasonal allergic rhinitis due to pollen   3. Essential hypertension   4. Current use of beta blocker   5 .  History of 3 coronary artery stents  Meds ordered this encounter  Medications  . fluticasone (FLONASE) 50 MCG/ACT nasal spray    Sig: 2 sprays per nostril once a day if needed for stuffy nose    Dispense:  18.2 mL    Refill:  5  . EPINEPHrine 0.3 mg/0.3 mL IJ SOAJ injection    Sig: Inject 0.3 mLs (0.3 mg total) into the muscle once for 1 dose.    Dispense:  2 each    Refill:  1     Patient Instructions  Environmental control of dust and mold Zyrtec 10 mg-take 1 tablet once a day if needed for runny nose or itchy eyes Fluticasone 2 sprays per nostril once a day if needed for stuffy nose  Avoid fish.  If you have an allergic reaction take  Benadryl 50 mg every 4 hours and if you have life-threatening symptoms inject with EpiPen 0.3 mg I will call you with your blood work for fish allergy to see which fish we could add to your diet  Continue on  your other medications Call us if you are not doing well on this treatment plan   Return in about 4 weeks (around 07/08/2019).   Thank you for the opportunity to care for this patient.  Please do not hesitate to contact me with questions.  Tonette BihariJ. A. Helaine Yackel, M.D.  [facility] [address] [place], [place] [address] [phone]

## 2019-06-14 LAB — ALLERGEN PROFILE, FOOD-FISH
Allergen Mackerel IgE: <0.1 kU/L
Allergen Salmon IgE: <0.1 kU/L
Allergen Trout IgE: <0.1 kU/L
Allergen Walley Pike IgE: <0.1 kU/L
Codfish IgE: <0.1 kU/L
Halibut IgE: <0.1 kU/L
Tuna: <0.1 kU/L

## 2019-06-14 LAB — ALLERGEN PROFILE, SHELLFISH
Clam IgE: <0.1 kU/L
F023-IgE Crab: <0.1 kU/L
F080-IgE Lobster: <0.1 kU/L
F290-IgE Oyster: <0.1 kU/L
Scallop IgE: <0.1 kU/L
Shrimp IgE: <0.1 kU/L

## 2019-12-11 ENCOUNTER — Telehealth: Payer: Self-pay | Admitting: Pediatrics

## 2019-12-11 NOTE — Telephone Encounter (Signed)
PT called to confirm sons appt. Also had questions about doing the oral fish challenge and what fish she is allergic to. Went over labs (negative shellfish) and went over skin testing (codfish 2+ reaction, negative for all other fishes). PT would like a copy of the skin test to see what fish she can eat. Advise will need to come in office to sign a ROI consent form. Please print skin test to have available for pt

## 2019-12-11 NOTE — Telephone Encounter (Signed)
Testing results have been printed waiting on pts ROI to be signed before giving them to her

## 2019-12-30 ENCOUNTER — Encounter: Payer: Medicare Other | Admitting: Pediatrics

## 2021-01-23 ENCOUNTER — Other Ambulatory Visit: Payer: Self-pay | Admitting: Pediatrics

## 2021-02-10 ENCOUNTER — Telehealth: Payer: Self-pay | Admitting: Family Medicine

## 2021-02-10 NOTE — Telephone Encounter (Signed)
Patient is having lots of drainage in her throat needs advise

## 2021-02-10 NOTE — Telephone Encounter (Signed)
Advised her to take antihistamine, neil med, and otc fluticasone. Follow up appt made for Monday at 11:20 with Dr. Delorse Lek.

## 2021-02-14 ENCOUNTER — Encounter: Payer: Self-pay | Admitting: Allergy

## 2021-02-14 ENCOUNTER — Ambulatory Visit (INDEPENDENT_AMBULATORY_CARE_PROVIDER_SITE_OTHER): Payer: Medicare Other | Admitting: Allergy

## 2021-02-14 ENCOUNTER — Other Ambulatory Visit: Payer: Self-pay

## 2021-02-14 VITALS — BP 132/72 | HR 78 | Temp 97.3°F | Resp 20 | Ht 65.0 in | Wt 224.4 lb

## 2021-02-14 DIAGNOSIS — J3089 Other allergic rhinitis: Secondary | ICD-10-CM

## 2021-02-14 DIAGNOSIS — T7800XD Anaphylactic reaction due to unspecified food, subsequent encounter: Secondary | ICD-10-CM

## 2021-02-14 MED ORDER — EPINEPHRINE 0.3 MG/0.3ML IJ SOAJ: 0.3000 mg | Freq: Once | INTRAMUSCULAR | 2 refills | Status: AC

## 2021-02-14 MED ORDER — LEVOCETIRIZINE DIHYDROCHLORIDE 5 MG PO TABS
5.0000 mg | ORAL_TABLET | Freq: Every evening | ORAL | 5 refills | Status: DC
Start: 2021-02-14 — End: 2021-10-26

## 2021-02-14 MED ORDER — AZELASTINE HCL 0.1 % NA SOLN: 2.0000 | Freq: Two times a day (BID) | NASAL | 5 refills | Status: DC

## 2021-02-14 NOTE — Progress Notes (Signed)
Follow-up Note  RE: Cindy Ellison MRN: [id number] DOB: May 30, 1951 Date of Office Visit: 02/14/2021   History of present illness: Cindy Ellison is a 70 y.o. female presenting today for sick visit.  She was last seen in the office for 06/10/2019 by Dr. Beaulah Dinning for allergic rhinitis and food allergy.    She states her pastor tested positive for covid and she reports she was around him a little under 2 weeks ago.   She states about 4 days ago she started having a runny nose and cough with a lot of throat clearing she states she has been using honey which she feels has helped with the cough.  She did do at home Covid test 2 days ago that was negative.  She does not have any more at home Covid test to use. She has been taking cetirizine daily but does not feel like it has been helpful.  She has used Flonase in the past but states she does not have this right now to use.  She denies any fevers, night sweats, chills or body aches.  She states her grandson also tested negative for Covid last week.  She states she does avoid all fish except for salmon.  She eats them without issue.  She does have access to an epinephrine device but it is expired.  Review of systems: Review of Systems  Constitutional: Negative.   HENT:       See HPI  Eyes: Negative.   Respiratory: Negative for sputum production, shortness of breath and wheezing.        See HPI  Cardiovascular: Negative.   Gastrointestinal: Negative.   Musculoskeletal: Negative.   Skin: Negative.   Neurological: Negative.     All other systems negative unless noted above in HPI  Past medical/social/surgical/family history have been reviewed and are unchanged unless specifically indicated below.  No changes  Medication List: Current Outpatient Medications  Medication Sig Dispense Refill  . aspirin 81 MG tablet Take 81 mg by mouth daily.    Marland Kitchen azelastine (ASTELIN) 0.1 % nasal spray Place 2 sprays into both nostrils 2 (two) times daily. Use  in each nostril as directed 30 mL 5  . diltiazem (CARDIZEM CD) 120 MG 24 hr capsule TAKE ONE CAPSULE BY MOUTH DAILY    . EPINEPHrine (AUVI-Q) 0.3 mg/0.3 mL IJ SOAJ injection Inject 0.3 mg into the muscle once for 1 dose. As directed for life-threatening allergic reactions 2 each 2  . fluticasone (FLONASE) 50 MCG/ACT nasal spray as needed.    . isosorbide mononitrate (IMDUR) 60 MG 24 hr tablet TAKE 1 TABLET BY MOUTH DAILY    . levocetirizine (XYZAL) 5 MG tablet Take 1 tablet (5 mg total) by mouth every evening. 30 tablet 5  . metoprolol succinate (TOPROL-XL) 100 MG 24 hr tablet Take 100 mg by mouth daily. Take with or immediately following a meal.    . nitroGLYCERIN (NITROSTAT) 0.4 MG SL tablet as needed.    . potassium chloride (K-DUR) 10 MEQ tablet TAKE 1 TABLET(10 MEQ) BY MOUTH DAILY    . potassium chloride (KLOR-CON) 10 MEQ tablet TAKE 2 TABLETS(20 MEQ) BY MOUTH DAILY    . rosuvastatin (CRESTOR) 5 MG tablet Take 5 mg by mouth daily.    Marland Kitchen zolpidem (AMBIEN) 5 MG tablet as needed.     No current facility-administered medications for this visit.     Known medication allergies: Allergies  Allergen Reactions  . Morphine   . Sulfa Antibiotics  Physical examination: Blood pressure 132/72, pulse 78, temperature (!) 97.3 F (36.3 C), temperature source Temporal, resp. rate 20, height 5\' 5"  (1.651 m), weight 224 lb 6.4 oz (101.8 kg), SpO2 98 %.  General: Alert, interactive, in no acute distress. HEENT: PERRLA, TMs pearly gray, turbinates minimally edematous with clear discharge, post-pharynx non erythematous. Neck: Supple without lymphadenopathy. Lungs: Clear to auscultation without wheezing, rhonchi or rales. {no increased work of breathing. CV: Normal S1, S2 without murmurs. Abdomen: Nondistended, nontender. Skin: Warm and dry, without lesions or rashes. Extremities:  No clubbing, cyanosis or edema. Neuro:   Grossly intact.  Diagnositics/Labs: None today  Assessment and  plan: Allergic rhinitis  Continue avoidance measures for grass pollen, weed pollen, cat, cockroach and mold Change Cetirizine to Xyzal 5mg  daily for allergy symptom control Start nasal Astelin 2 sprays each nostril twice a day for runny nose control Can use Fluticasone 2 sprays per nostril once a day if needed for stuffy nose Can continue use of honey (best to use local honey) If you do not notice significant improvement in symptoms with the above allergen medication regimen then recommend you repeat Covid testing to ensure negative x 2 with recent positive exposure  Food allergy Avoid fish.  If you have an allergic reaction take Benadryl 50 mg every 4 hours and if you have life-threatening symptoms inject with EpiPen 0.3 mg (will refill today)   Follow-up in 4-6 months or sooner if needed   I appreciate the opportunity to take part in Cindy Ellison's care. Please do not hesitate to contact me with questions.  Sincerely,   , MD Allergy/Immunology [facility]

## 2021-02-14 NOTE — Patient Instructions (Addendum)
Continue avoidance measures for grass pollen, weed pollen, cat, cockroach and mold Change Cetirizine to Xyzal 5mg  daily for allergy symptom control Start nasal Astelin 2 sprays each nostril twice a day for runny nose control Can use Fluticasone 2 sprays per nostril once a day if needed for stuffy nose Can continue use of honey (best to use local honey)  Avoid fish.  If you have an allergic reaction take Benadryl 50 mg every 4 hours and if you have life-threatening symptoms inject with EpiPen 0.3 mg (will refill today)  If you do not notice significant improvement in symptoms with the above allergen medication regimen then recommend you repeat Covid testing to ensure negative x 2 with recent positive exposure  Follow-up in 4-6 months or sooner if needed

## 2021-10-26 ENCOUNTER — Other Ambulatory Visit: Payer: Self-pay | Admitting: Allergy

## 2022-06-22 ENCOUNTER — Other Ambulatory Visit: Payer: Self-pay | Admitting: Allergy

## 2022-06-24 ENCOUNTER — Other Ambulatory Visit: Payer: Self-pay | Admitting: Allergy

## 2022-06-26 ENCOUNTER — Telehealth: Payer: Self-pay | Admitting: Allergy

## 2022-06-26 NOTE — Telephone Encounter (Signed)
Patient calling stating Rx levocetirizine (XYZAL) 5 MG tablet [[id number]]  needs a PA please advise

## 2022-06-26 NOTE — Telephone Encounter (Signed)
Pt has not been seen since 01/2021 and needs an office visit before we can do a PA. Also she can buy Xyzal over the counter.

## 2022-10-18 ENCOUNTER — Encounter: Payer: Self-pay | Admitting: Internal Medicine

## 2022-10-18 ENCOUNTER — Ambulatory Visit (INDEPENDENT_AMBULATORY_CARE_PROVIDER_SITE_OTHER): Payer: Medicare PPO | Admitting: Internal Medicine

## 2022-10-18 VITALS — BP 160/80 | HR 69 | Temp 97.7°F | Resp 18 | Wt 224.5 lb

## 2022-10-18 DIAGNOSIS — J302 Other seasonal allergic rhinitis: Secondary | ICD-10-CM

## 2022-10-18 DIAGNOSIS — J3089 Other allergic rhinitis: Secondary | ICD-10-CM | POA: Diagnosis not present

## 2022-10-18 DIAGNOSIS — T7800XA Anaphylactic reaction due to unspecified food, initial encounter: Secondary | ICD-10-CM

## 2022-10-18 DIAGNOSIS — R030 Elevated blood-pressure reading, without diagnosis of hypertension: Secondary | ICD-10-CM | POA: Diagnosis not present

## 2022-10-18 MED ORDER — FLUTICASONE PROPIONATE 50 MCG/ACT NA SUSP: 2.0000 | NASAL | 6 refills | Status: AC | PRN

## 2022-10-18 MED ORDER — AZELASTINE HCL 0.1 % NA SOLN
2.0000 | Freq: Two times a day (BID) | NASAL | 5 refills | Status: DC
Start: 2022-10-18 — End: 2022-10-18

## 2022-10-18 MED ORDER — LEVOCETIRIZINE DIHYDROCHLORIDE 5 MG PO TABS
ORAL_TABLET | ORAL | 5 refills | Status: AC
Start: 2022-10-18 — End: ?

## 2022-10-18 MED ORDER — EPINEPHRINE 0.3 MG/0.3ML IJ SOAJ: 0.3000 mg | INTRAMUSCULAR | 1 refills | Status: AC | PRN

## 2022-10-18 MED ORDER — IPRATROPIUM BROMIDE 0.06 % NA SOLN: 2.0000 | Freq: Four times a day (QID) | NASAL | 12 refills | Status: AC

## 2022-10-18 NOTE — Patient Instructions (Addendum)
Allergic rhinitis  Continue avoidance measures for grass pollen, weed pollen, cat, cockroach and mold Restart Xyzal 5mg  daily for allergy symptom control START nasal ipatropium for runny nose: 1 spray per nostril up to 3-4 times a day  Can use Fluticasone 2 sprays per nostril once a day     Food allergy   We will get blood work for salmon and update other fish testing  Continue to avoid salmon and trigger fish for now  If blood work is negative, this could be due to mislabeling and recommend avoiding that brand If blood work is positive, recommend starting strict avoidance of salmon  Avoid fish.  If you have an allergic reaction take Benadryl 50 mg every 4 hours and if you have life-threatening symptoms inject with EpiPen 0.3 mg (will refill today)  Blood pressure was elevated today.  Follow up with Primary Care.    Follow up:  6 months   Thank you so much for letting me partake in your care today.  Don't hesitate to reach out if you have any additional concerns!  , MD  [facility] [place]  Control of Dog or Cat Allergen  Avoidance is the best way to manage a dog or cat allergy. If you have a dog or cat and are allergic to dog or cats, consider removing the dog or cat from the home. If you have a dog or cat but don't want to find it a new home, or if your family wants a pet even though someone in the household is allergic, here are some strategies that may help keep symptoms at bay:  Keep the pet out of your bedroom and restrict it to only a few rooms. Be advised that keeping the dog or cat in only one room will not limit the allergens to that room. Don't pet, hug or kiss the dog or cat; if you do, wash your hands with soap and water. High-efficiency particulate air (HEPA) cleaners run continuously in a bedroom or living room can reduce allergen levels over time. Regular use of a high-efficiency vacuum cleaner or a central vacuum can reduce  allergen levels. Giving your dog or cat a bath at least once a week can reduce airborne allergen.  Reducing Pollen Exposure  The American Academy of Allergy, Asthma and Immunology suggests the following steps to reduce your exposure to pollen during allergy seasons.    Do not hang sheets or clothing out to dry; pollen may collect on these items. Do not mow lawns or spend time around freshly cut grass; mowing stirs up pollen. Keep windows closed at night.  Keep car windows closed while driving. Minimize morning activities outdoors, a time when pollen counts are usually at their highest. Stay indoors as much as possible when pollen counts or humidity is high and on windy days when pollen tends to remain in the air longer. Use air conditioning when possible.  Many air conditioners have filters that trap the pollen spores. Use a HEPA room air filter to remove pollen form the indoor air you breathe. Control of Cockroach Allergen  Cockroach allergen has been identified as an important cause of acute attacks of asthma, especially in urban settings.  There are fifty-five species of cockroach that exist in the [place], however only three, the [place], [place] species produce allergen that can affect patients with Asthma.  Allergens can be obtained from fecal particles, egg casings and secretions from cockroaches.  Remove food sources. Reduce access to water. Seal access and entry points. Spray runways with 0.5-1% Diazinon or Chlorpyrifos Blow boric acid power under stoves and refrigerator. Place bait stations (hydramethylnon) at feeding sites.  Control of Mold Allergen   Mold and fungi can grow on a variety of surfaces provided certain temperature and moisture conditions exist.  Outdoor molds grow on plants, decaying vegetation and soil.  The major outdoor mold, Alternaria and Cladosporium, are found in very high numbers during hot and dry conditions.  Generally, a late Summer  - Fall peak is seen for common outdoor fungal spores.  Rain will temporarily lower outdoor mold spore count, but counts rise rapidly when the rainy period ends.  The most important indoor molds are Aspergillus and Penicillium.  Dark, humid and poorly ventilated basements are ideal sites for mold growth.  The next most common sites of mold growth are the bathroom and the kitchen.  Outdoor (Seasonal) Mold Control  Use air conditioning and keep windows closed Avoid exposure to decaying vegetation. Avoid leaf raking. Avoid grain handling. Consider wearing a face mask if working in moldy areas.    Indoor (Perennial) Mold Control   Maintain humidity below 50%. Clean washable surfaces with 5% bleach solution. Remove sources e.g. contaminated carpets.

## 2022-10-18 NOTE — Progress Notes (Signed)
Follow Up Note  RE: Cindy Ellison MRN: [id number] DOB: 06/11/1951 Date of Office Visit: 10/18/2022  Referring provider: Yolanda Manges, DO Primary care provider: Yolanda Manges, DO  Chief Complaint: Follow-up, Medication Refill, and Allergic Reaction (To salmon)  History of Present Illness: I had the pleasure of seeing Cindy Ellison for a follow up visit at the [facility] on 10/18/2022. She is a 71 y.o. female, who is being followed for fish allergy and allergic reactions. Her previous allergy office visit was on 06/26/22 with Dr. Delorse Lek. Today is a regular follow up visit.  History obtained from patient, chart review.  Food Allergy: continues to avoid fish (index reaction was to cod, avoids most fish but salmon)   -1 accidental exposures:  this past Monday she ate salmon and Tuesday she had throat tightness and itching.  Resolved with benadryl.  Cindy Ellison was bought from a new store, she has previously tolerated salmon she has bought from other places.   - 0 use of epinephrine, needs refill  -Previous testing: needs updating   Allergic  Rhinitis: current therapy: xyzal, flonase; not using astelin  symptoms improved, flonase will works well, will have rhinnorhea occasionally  symptoms include: rhinorrhea Previous allergy testing: yes History of reflux/heartburn: no Interested in Allergy Immunotherapy: no  Needs refills of all medications.  Denies any adverse effects of medications    Assessment and Plan: Cindy Ellison is a 71 y.o. female with: Allergy with anaphylaxis due to food - Plan: Allergen, Salmon, f41, Allergen Profile, Food-Fish  Seasonal and perennial allergic rhinitis  Elevated blood pressure reading Plan: Patient Instructions   Allergic rhinitis  Continue avoidance measures for grass pollen, weed pollen, cat, cockroach and mold Restart Xyzal 5mg  daily for allergy symptom control START nasal ipatropium for runny nose: 1 spray per nostril up to 3-4 times  a day  Can use Fluticasone 2 sprays per nostril once a day     Food allergy   We will get blood work for salmon and update other fish testing  Continue to avoid salmon and trigger fish for now  If blood work is negative, this could be due to mislabeling and recommend avoiding that brand If blood work is positive, recommend starting strict avoidance of salmon  Avoid fish.  If you have an allergic reaction take Benadryl 50 mg every 4 hours and if you have life-threatening symptoms inject with EpiPen 0.3 mg (will refill today)  Blood pressure was elevated today.  Follow up with Primary Care.    Follow up:  6 months   Thank you so much for letting me partake in your care today.  Don't hesitate to reach out if you have any additional concerns!  , MD  [facility] [place] [place]  Control of Dog or Cat Allergen  Avoidance is the best way to manage a dog or cat allergy. If you have a dog or cat and are allergic to dog or cats, consider removing the dog or cat from the home. If you have a dog or cat but don't want to find it a new home, or if your family wants a pet even though someone in the household is allergic, here are some strategies that may help keep symptoms at bay:  Keep the pet out of your bedroom and restrict it to only a few rooms. Be advised that keeping the dog or cat in only one room will not limit the allergens to that room.  Don't pet, hug or kiss the dog or cat; if you do, wash your hands with soap and water. High-efficiency particulate air (HEPA) cleaners run continuously in a bedroom or living room can reduce allergen levels over time. Regular use of a high-efficiency vacuum cleaner or a central vacuum can reduce allergen levels. Giving your dog or cat a bath at least once a week can reduce airborne allergen.  Reducing Pollen Exposure  The American Academy of Allergy, Asthma and Immunology suggests the following steps to reduce your exposure  to pollen during allergy seasons.    Do not hang sheets or clothing out to dry; pollen may collect on these items. Do not mow lawns or spend time around freshly cut grass; mowing stirs up pollen. Keep windows closed at night.  Keep car windows closed while driving. Minimize morning activities outdoors, a time when pollen counts are usually at their highest. Stay indoors as much as possible when pollen counts or humidity is high and on windy days when pollen tends to remain in the air longer. Use air conditioning when possible.  Many air conditioners have filters that trap the pollen spores. Use a HEPA room air filter to remove pollen form the indoor air you breathe. Control of Cockroach Allergen  Cockroach allergen has been identified as an important cause of acute attacks of asthma, especially in urban settings.  There are fifty-five species of cockroach that exist in the [place], however only three, the [place], [place] and [place] species produce allergen that can affect patients with Asthma.  Allergens can be obtained from fecal particles, egg casings and secretions from cockroaches.    Remove food sources. Reduce access to water. Seal access and entry points. Spray runways with 0.5-1% Diazinon or Chlorpyrifos Blow boric acid power under stoves and refrigerator. Place bait stations (hydramethylnon) at feeding sites.  Control of Mold Allergen   Mold and fungi can grow on a variety of surfaces provided certain temperature and moisture conditions exist.  Outdoor molds grow on plants, decaying vegetation and soil.  The major outdoor mold, Alternaria and Cladosporium, are found in very high numbers during hot and dry conditions.  Generally, a late Summer - Fall peak is seen for common outdoor fungal spores.  Rain will temporarily lower outdoor mold spore count, but counts rise rapidly when the rainy period ends.  The most important indoor molds are Aspergillus and Penicillium.  Dark,  humid and poorly ventilated basements are ideal sites for mold growth.  The next most common sites of mold growth are the bathroom and the kitchen.  Outdoor (Seasonal) Mold Control  Use air conditioning and keep windows closed Avoid exposure to decaying vegetation. Avoid leaf raking. Avoid grain handling. Consider wearing a face mask if working in moldy areas.    Indoor (Perennial) Mold Control   Maintain humidity below 50%. Clean washable surfaces with 5% bleach solution. Remove sources e.g. contaminated carpets.     No follow-ups on file.  Meds ordered this encounter  Medications   DISCONTD: azelastine (ASTELIN) 0.1 % nasal spray    Sig: Place 2 sprays into both nostrils 2 (two) times daily. Use in each nostril as directed    Dispense:  30 mL    Refill:  5   fluticasone (FLONASE) 50 MCG/ACT nasal spray    Sig: Place 2 sprays into both nostrils as needed.    Dispense:  11.1 mL    Refill:  6   levocetirizine (XYZAL) 5 MG tablet  Sig: TAKE 1 TABLET(5 MG) BY MOUTH EVERY EVENING    Dispense:  30 tablet    Refill:  5   EPINEPHrine 0.3 mg/0.3 mL IJ SOAJ injection    Sig: Inject 0.3 mg into the muscle as needed for anaphylaxis.    Dispense:  1 each    Refill:  1   ipratropium (ATROVENT) 0.06 % nasal spray    Sig: Place 2 sprays into both nostrils 4 (four) times daily.    Dispense:  15 mL    Refill:  12    Lab Orders         Allergen, Salmon, f41         Allergen Profile, Food-Fish     Diagnostics: None done    Medication List:  Current Outpatient Medications  Medication Sig Dispense Refill   aspirin 81 MG tablet Take 81 mg by mouth daily.     diltiazem (CARDIZEM CD) 120 MG 24 hr capsule TAKE ONE CAPSULE BY MOUTH DAILY     EPINEPHrine 0.3 mg/0.3 mL IJ SOAJ injection Inject 0.3 mg into the muscle as needed for anaphylaxis. 1 each 1   ipratropium (ATROVENT) 0.06 % nasal spray Place 2 sprays into both nostrils 4 (four) times daily. 15 mL 12   isosorbide  mononitrate (IMDUR) 60 MG 24 hr tablet TAKE 1 TABLET BY MOUTH DAILY     metoprolol succinate (TOPROL-XL) 100 MG 24 hr tablet Take 100 mg by mouth daily. Take with or immediately following a meal.     nitroGLYCERIN (NITROSTAT) 0.4 MG SL tablet as needed.     potassium chloride (K-DUR) 10 MEQ tablet TAKE 1 TABLET(10 MEQ) BY MOUTH DAILY     potassium chloride (KLOR-CON) 10 MEQ tablet TAKE 2 TABLETS(20 MEQ) BY MOUTH DAILY     rosuvastatin (CRESTOR) 5 MG tablet Take 5 mg by mouth daily.     zolpidem (AMBIEN) 5 MG tablet as needed.     fluticasone (FLONASE) 50 MCG/ACT nasal spray Place 2 sprays into both nostrils as needed. 11.1 mL 6   levocetirizine (XYZAL) 5 MG tablet TAKE 1 TABLET(5 MG) BY MOUTH EVERY EVENING 30 tablet 5   No current facility-administered medications for this visit.   Allergies: Allergies  Allergen Reactions   Morphine    Sulfa Antibiotics    I reviewed her past medical history, social history, family history, and environmental history and no significant changes have been reported from her previous visit.  ROS: All others negative except as noted per HPI.   Objective: BP (!) 160/80 Comment: check this 3 times and still same  Pulse 69   Temp 97.7 F (36.5 C) (Temporal)   Resp 18   Wt 224 lb 8 oz (101.8 kg)   SpO2 98%   BMI 37.36 kg/m  Body mass index is 37.36 kg/m. General Appearance:  Alert, cooperative, no distress, appears stated age  Head:  Normocephalic, without obvious abnormality, atraumatic  Eyes:  Conjunctiva clear, EOM's intact  Nose: Nares normal, normal mucosa, no visible anterior polyps, and septum midline  Throat: Lips, tongue normal; teeth and gums normal, normal posterior oropharynx  Neck: Supple, symmetrical  Lungs:   clear to auscultation bilaterally, Respirations unlabored, no coughing  Heart:  regular rate and rhythm and no murmur, Appears well perfused  Extremities: No edema  Skin: Skin color, texture, turgor normal, no rashes or lesions on  visualized portions of skin   Neurologic: No gross deficits   Previous notes and tests were reviewed. The plan  was reviewed with the patient/family, and all questions/concerned were addressed.  It was my pleasure to see Sahory today and participate in her care. Please feel free to contact me with any questions or concerns.  Sincerely,  [place], MD  Allergy & Immunology  [facility] Office: [phone]

## 2022-10-21 LAB — ALLERGEN PROFILE, FOOD-FISH
Allergen Mackerel IgE: <0.1 kU/L
Allergen Salmon IgE: <0.1 kU/L
Allergen Trout IgE: <0.1 kU/L
Allergen Walley Pike IgE: <0.1 kU/L
Codfish IgE: <0.1 kU/L
Halibut IgE: <0.1 kU/L
Tuna: <0.1 kU/L

## 2022-10-24 NOTE — Progress Notes (Signed)
Blood work was negative to all fish.  Recommend she returns for skin testing for further evaluation.  This can be done at next visit.  In the meantime I suspect reaction to salmon was due to mislabeling.  She should only eat salmon from trusted vendors.  Can someone let patient know?

## 2022-11-03 ENCOUNTER — Telehealth: Payer: Self-pay | Admitting: Internal Medicine

## 2022-11-03 NOTE — Telephone Encounter (Signed)
Pt returned a call for lab results.  

## 2022-11-06 NOTE — Telephone Encounter (Signed)
Cindy Ellison left pt a message on Friday about calling us back for results

## 2023-01-23 ENCOUNTER — Other Ambulatory Visit: Payer: Self-pay | Admitting: Allergy

## 2023-02-19 ENCOUNTER — Ambulatory Visit: Payer: Medicare PPO | Admitting: Internal Medicine

## 2023-06-05 ENCOUNTER — Emergency Department (HOSPITAL_BASED_OUTPATIENT_CLINIC_OR_DEPARTMENT_OTHER)
Admission: EM | Admit: 2023-06-05 | Discharge: 2023-06-05 | Disposition: A | Discharge status: Home / Self Care | Payer: Medicare PPO | Attending: Emergency Medicine | Admitting: Emergency Medicine

## 2023-06-05 ENCOUNTER — Other Ambulatory Visit: Payer: Self-pay

## 2023-06-05 ENCOUNTER — Emergency Department (HOSPITAL_BASED_OUTPATIENT_CLINIC_OR_DEPARTMENT_OTHER): Payer: Medicare PPO

## 2023-06-05 ENCOUNTER — Encounter (HOSPITAL_BASED_OUTPATIENT_CLINIC_OR_DEPARTMENT_OTHER): Payer: Self-pay | Admitting: Emergency Medicine

## 2023-06-05 DIAGNOSIS — S161XXA Strain of muscle, fascia and tendon at neck level, initial encounter: Secondary | ICD-10-CM | POA: Insufficient documentation

## 2023-06-05 DIAGNOSIS — I10 Essential (primary) hypertension: Secondary | ICD-10-CM | POA: Diagnosis not present

## 2023-06-05 DIAGNOSIS — Y92410 Unspecified street and highway as the place of occurrence of the external cause: Secondary | ICD-10-CM | POA: Diagnosis not present

## 2023-06-05 DIAGNOSIS — R519 Headache, unspecified: Secondary | ICD-10-CM | POA: Insufficient documentation

## 2023-06-05 DIAGNOSIS — Z79899 Other long term (current) drug therapy: Secondary | ICD-10-CM | POA: Insufficient documentation

## 2023-06-05 DIAGNOSIS — M542 Cervicalgia: Secondary | ICD-10-CM | POA: Diagnosis present

## 2023-06-05 MED ORDER — HYDROCODONE-ACETAMINOPHEN 5-325 MG PO TABS
1.0000 | ORAL_TABLET | Freq: Once | ORAL | Status: DC
  Filled 2023-06-05: qty 1

## 2023-06-05 MED ORDER — LIDOCAINE 4 % EX PTCH: 1.0000 | MEDICATED_PATCH | CUTANEOUS | 0 refills | Status: AC

## 2023-06-05 MED ORDER — ACETAMINOPHEN 325 MG PO TABS
650.0000 mg | ORAL_TABLET | Freq: Once | ORAL | Status: AC
  Administered 2023-06-05: 650 mg via ORAL
  Filled 2023-06-05: qty 2

## 2023-06-05 NOTE — Discharge Instructions (Addendum)
Please follow-up with your primary care doctor, return to the ER if you have severe headache, intractable nausea, or vomiting.  You can use ice, and heat to help with the pain, as well as Tylenol, and lidocaine patches.
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
## 2023-12-05 ENCOUNTER — Other Ambulatory Visit: Payer: Self-pay | Admitting: Internal Medicine

## 2024-01-25 ENCOUNTER — Other Ambulatory Visit: Payer: Self-pay | Admitting: Internal Medicine
